# Patient Record
Sex: Female | Born: 1975 | Race: White | Hispanic: No | Marital: Married | Smoking: Current every day smoker
Health system: Southern US, Community
[De-identification: ages and names within clinical notes are randomized; demographics above are authoritative.]

## PROBLEM LIST (undated history)

## (undated) DIAGNOSIS — N83209 Unspecified ovarian cyst, unspecified side: Secondary | ICD-10-CM

## (undated) DIAGNOSIS — F419 Anxiety disorder, unspecified: Secondary | ICD-10-CM

## (undated) DIAGNOSIS — F329 Major depressive disorder, single episode, unspecified: Secondary | ICD-10-CM

## (undated) DIAGNOSIS — F1010 Alcohol abuse, uncomplicated: Secondary | ICD-10-CM

## (undated) DIAGNOSIS — F32A Depression, unspecified: Secondary | ICD-10-CM

## (undated) HISTORY — DX: Anxiety disorder, unspecified: F41.9

## (undated) HISTORY — PX: TOTAL KNEE ARTHROPLASTY: SHX125

## (undated) HISTORY — DX: Depression, unspecified: F32.A

## (undated) HISTORY — DX: Major depressive disorder, single episode, unspecified: F32.9

---

## 2008-01-20 ENCOUNTER — Emergency Department (HOSPITAL_BASED_OUTPATIENT_CLINIC_OR_DEPARTMENT_OTHER): Admission: EM | Admit: 2008-01-20 | Discharge: 2008-01-20 | Payer: Self-pay | Admitting: Emergency Medicine

## 2008-07-01 ENCOUNTER — Emergency Department (HOSPITAL_BASED_OUTPATIENT_CLINIC_OR_DEPARTMENT_OTHER): Admission: EM | Admit: 2008-07-01 | Discharge: 2008-07-01 | Payer: Self-pay | Admitting: Emergency Medicine

## 2008-07-01 ENCOUNTER — Ambulatory Visit: Payer: Self-pay | Admitting: Diagnostic Radiology

## 2010-08-11 ENCOUNTER — Encounter: Payer: Self-pay | Admitting: Emergency Medicine

## 2010-09-14 ENCOUNTER — Emergency Department (HOSPITAL_BASED_OUTPATIENT_CLINIC_OR_DEPARTMENT_OTHER)
Admission: EM | Admit: 2010-09-14 | Discharge: 2010-09-14 | Disposition: A | Discharge status: Home / Self Care | Payer: 59 | Attending: Emergency Medicine | Admitting: Emergency Medicine

## 2010-09-14 DIAGNOSIS — IMO0001 Reserved for inherently not codable concepts without codable children: Secondary | ICD-10-CM | POA: Insufficient documentation

## 2010-09-14 DIAGNOSIS — R05 Cough: Secondary | ICD-10-CM | POA: Insufficient documentation

## 2010-09-14 DIAGNOSIS — F319 Bipolar disorder, unspecified: Secondary | ICD-10-CM | POA: Insufficient documentation

## 2010-09-14 DIAGNOSIS — R059 Cough, unspecified: Secondary | ICD-10-CM | POA: Insufficient documentation

## 2010-09-14 DIAGNOSIS — J029 Acute pharyngitis, unspecified: Secondary | ICD-10-CM | POA: Insufficient documentation

## 2010-09-14 DIAGNOSIS — B9789 Other viral agents as the cause of diseases classified elsewhere: Secondary | ICD-10-CM | POA: Insufficient documentation

## 2010-09-14 DIAGNOSIS — F17200 Nicotine dependence, unspecified, uncomplicated: Secondary | ICD-10-CM | POA: Insufficient documentation

## 2011-04-16 LAB — CBC
MCHC: 35.1
MCV: 88.3
Platelets: 236

## 2011-04-16 LAB — URINALYSIS, ROUTINE W REFLEX MICROSCOPIC
Hgb urine dipstick: NEGATIVE
Ketones, ur: NEGATIVE
Protein, ur: NEGATIVE
Urobilinogen, UA: 0.2

## 2011-04-16 LAB — URINE MICROSCOPIC-ADD ON

## 2011-04-16 LAB — DIFFERENTIAL
Basophils Relative: 2 — ABNORMAL HIGH
Eosinophils Absolute: 0.2
Monocytes Relative: 8
Neutrophils Relative %: 67

## 2011-04-16 LAB — BASIC METABOLIC PANEL
BUN: 16
CO2: 22
Chloride: 106
Creatinine, Ser: 0.8

## 2012-03-27 ENCOUNTER — Emergency Department (HOSPITAL_BASED_OUTPATIENT_CLINIC_OR_DEPARTMENT_OTHER)
Admission: EM | Admit: 2012-03-27 | Discharge: 2012-03-27 | Disposition: A | Discharge status: Home / Self Care | Payer: 59

## 2012-03-27 ENCOUNTER — Encounter (HOSPITAL_BASED_OUTPATIENT_CLINIC_OR_DEPARTMENT_OTHER): Payer: Self-pay | Admitting: *Deleted

## 2012-03-27 NOTE — ED Notes (Signed)
Called x 3 in waiting room and no answer. Did not inform staff that she was leaving

## 2012-03-27 NOTE — ED Notes (Signed)
Pt. Has multiple complaints. States she has been taking care of her twins alone and this is "wearing her body out"  C/o left wrist pain denies injury no deformity noted. C/o bilateral leg pain and "brusing to her legs from taking care of her kids. Also, c/o "heavy" menstural bleeding for past 2 years.

## 2012-03-27 NOTE — ED Notes (Signed)
Called x 2 in waiting area 

## 2012-09-08 ENCOUNTER — Encounter (HOSPITAL_COMMUNITY): Payer: Self-pay | Admitting: *Deleted

## 2012-09-08 ENCOUNTER — Ambulatory Visit (HOSPITAL_COMMUNITY)
Admission: RE | Admit: 2012-09-08 | Discharge: 2012-09-08 | Disposition: A | Discharge status: Home / Self Care | Payer: 59 | Source: Home / Self Care | Attending: Psychiatry | Admitting: Psychiatry

## 2012-09-08 ENCOUNTER — Emergency Department (HOSPITAL_COMMUNITY)
Admission: EM | Admit: 2012-09-08 | Discharge: 2012-09-08 | Disposition: A | Discharge status: Other Acute Inpatient Hospital | Payer: 59 | Attending: Emergency Medicine | Admitting: Emergency Medicine

## 2012-09-08 ENCOUNTER — Inpatient Hospital Stay (HOSPITAL_COMMUNITY)
Admission: EM | Admit: 2012-09-08 | Discharge: 2012-09-10 | DRG: 897 | Disposition: A | Discharge status: Home / Self Care | Payer: 59 | Source: Intra-hospital | Attending: Psychiatry | Admitting: Psychiatry

## 2012-09-08 ENCOUNTER — Encounter (HOSPITAL_COMMUNITY): Payer: Self-pay | Admitting: Emergency Medicine

## 2012-09-08 DIAGNOSIS — Z7982 Long term (current) use of aspirin: Secondary | ICD-10-CM | POA: Insufficient documentation

## 2012-09-08 DIAGNOSIS — Z79899 Other long term (current) drug therapy: Secondary | ICD-10-CM | POA: Insufficient documentation

## 2012-09-08 DIAGNOSIS — F329 Major depressive disorder, single episode, unspecified: Secondary | ICD-10-CM | POA: Diagnosis present

## 2012-09-08 DIAGNOSIS — F411 Generalized anxiety disorder: Secondary | ICD-10-CM | POA: Insufficient documentation

## 2012-09-08 DIAGNOSIS — F1310 Sedative, hypnotic or anxiolytic abuse, uncomplicated: Principal | ICD-10-CM | POA: Diagnosis present

## 2012-09-08 DIAGNOSIS — F1210 Cannabis abuse, uncomplicated: Secondary | ICD-10-CM | POA: Diagnosis present

## 2012-09-08 DIAGNOSIS — F419 Anxiety disorder, unspecified: Secondary | ICD-10-CM

## 2012-09-08 DIAGNOSIS — F3289 Other specified depressive episodes: Secondary | ICD-10-CM | POA: Insufficient documentation

## 2012-09-08 DIAGNOSIS — F17200 Nicotine dependence, unspecified, uncomplicated: Secondary | ICD-10-CM | POA: Insufficient documentation

## 2012-09-08 DIAGNOSIS — F4310 Post-traumatic stress disorder, unspecified: Secondary | ICD-10-CM | POA: Diagnosis present

## 2012-09-08 HISTORY — DX: Alcohol abuse, uncomplicated: F10.10

## 2012-09-08 LAB — CBC WITH DIFFERENTIAL/PLATELET
Eosinophils Relative: 1 % (ref 0–5)
HCT: 37.7 % (ref 36.0–46.0)
Hemoglobin: 12.5 g/dL (ref 12.0–15.0)
Lymphocytes Relative: 26 % (ref 12–46)
Lymphs Abs: 2.4 10*3/uL (ref 0.7–4.0)
MCV: 84.2 fL (ref 78.0–100.0)
Monocytes Absolute: 0.5 10*3/uL (ref 0.1–1.0)
RBC: 4.48 MIL/uL (ref 3.87–5.11)
WBC: 9.2 10*3/uL (ref 4.0–10.5)

## 2012-09-08 LAB — COMPREHENSIVE METABOLIC PANEL
ALT: 13 U/L (ref 0–35)
CO2: 25 mEq/L (ref 19–32)
Calcium: 9.7 mg/dL (ref 8.4–10.5)
Chloride: 101 mEq/L (ref 96–112)
Creatinine, Ser: 0.68 mg/dL (ref 0.50–1.10)
GFR calc Af Amer: >90 mL/min (ref >90)
GFR calc non Af Amer: >90 mL/min (ref >90)
Glucose, Bld: 94 mg/dL (ref 70–99)
Total Bilirubin: 0.5 mg/dL (ref 0.3–1.2)

## 2012-09-08 LAB — RAPID URINE DRUG SCREEN, HOSP PERFORMED
Benzodiazepines: POSITIVE — AB
Cocaine: NOT DETECTED

## 2012-09-08 LAB — URINALYSIS, ROUTINE W REFLEX MICROSCOPIC
Glucose, UA: NEGATIVE mg/dL
Specific Gravity, Urine: 1.007 (ref 1.005–1.030)
pH: 6.5 (ref 5.0–8.0)

## 2012-09-08 LAB — URINE MICROSCOPIC-ADD ON

## 2012-09-08 LAB — ETHANOL: Alcohol, Ethyl (B): <11 mg/dL (ref 0–11)

## 2012-09-08 MED ORDER — MAGNESIUM HYDROXIDE 400 MG/5ML PO SUSP: 30.0000 mL | Freq: Every day | ORAL | Status: DC | PRN

## 2012-09-08 MED ORDER — INFLUENZA VIRUS VACC SPLIT PF IM SUSP
0.5000 mL | INTRAMUSCULAR | Status: AC
  Administered 2012-09-09: 0.5 mL via INTRAMUSCULAR

## 2012-09-08 MED ORDER — CHLORDIAZEPOXIDE HCL 25 MG PO CAPS
25.0000 mg | ORAL_CAPSULE | Freq: Four times a day (QID) | ORAL | Status: DC | PRN
  Administered 2012-09-08 – 2012-09-09 (×2): 25 mg via ORAL
  Filled 2012-09-08 (×4): qty 1

## 2012-09-08 MED ORDER — NICOTINE 21 MG/24HR TD PT24
21.0000 mg | MEDICATED_PATCH | Freq: Once | TRANSDERMAL | Status: DC
  Administered 2012-09-08: 21 mg via TRANSDERMAL
  Filled 2012-09-08: qty 1

## 2012-09-08 MED ORDER — ACETAMINOPHEN 325 MG PO TABS
650.0000 mg | ORAL_TABLET | Freq: Four times a day (QID) | ORAL | Status: DC | PRN
  Administered 2012-09-08 – 2012-09-09 (×2): 650 mg via ORAL

## 2012-09-08 MED ORDER — NICOTINE 21 MG/24HR TD PT24
21.0000 mg | MEDICATED_PATCH | Freq: Every day | TRANSDERMAL | Status: DC
  Administered 2012-09-09: 21 mg via TRANSDERMAL
  Filled 2012-09-08 (×4): qty 1

## 2012-09-08 MED ORDER — ALUM & MAG HYDROXIDE-SIMETH 200-200-20 MG/5ML PO SUSP: 30.0000 mL | ORAL | Status: DC | PRN

## 2012-09-08 MED ORDER — PNEUMOCOCCAL VAC POLYVALENT 25 MCG/0.5ML IJ INJ
0.5000 mL | INJECTION | INTRAMUSCULAR | Status: AC
  Administered 2012-09-09: 0.5 mL via INTRAMUSCULAR

## 2012-09-08 MED ORDER — HYDROXYZINE HCL 50 MG PO TABS: 50.0000 mg | ORAL_TABLET | Freq: Every evening | ORAL | Status: DC | PRN

## 2012-09-08 NOTE — Tx Team (Signed)
Initial Interdisciplinary Treatment Plan  PATIENT STRENGTHS: (choose at least two) Ability for insight Active sense of humor Average or above average intelligence Capable of independent living Communication skills General fund of knowledge Motivation for treatment/growth  PATIENT STRESSORS: Marital or family conflict Medication change or noncompliance Substance abuse   PROBLEM LIST: Problem List/Patient Goals Date to be addressed Date deferred Reason deferred Estimated date of resolution  Depression 09/08/12     Anxiety 09/08/12                                                DISCHARGE CRITERIA:  Ability to meet basic life and health needs Adequate post-discharge living arrangements Improved stabilization in mood, thinking, and/or behavior Safe-care adequate arrangements made Withdrawal symptoms are absent or subacute and managed without 24-hour nursing intervention  PRELIMINARY DISCHARGE PLAN: Attend aftercare/continuing care group Placement in alternative living arrangements  PATIENT/FAMIILY INVOLVEMENT: This treatment plan has been presented to and reviewed with the patient, Nautia Lem, and/or family member, .  The patient and family have been given the opportunity to ask questions and make suggestions.  Donisha Hoch, [place] 09/08/2012, 9:40 PM

## 2012-09-08 NOTE — ED Provider Notes (Signed)
Medical screening examination/treatment/procedure(s) were performed by non-physician practitioner and as supervising physician I was immediately available for consultation/collaboration.  Kallen Delatorre R. Beatrix Breece, MD 09/08/12 2311 

## 2012-09-08 NOTE — ED Notes (Signed)
Pt is sent over from [facility] for detox, pt is voluntary and was to come over for med clearence and to be sent back over to [facility]. Denies any Si/Hi
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
## 2012-09-08 NOTE — ED Provider Notes (Signed)
History    This chart was scribed for non-physician practitioner working with Penny Mata. [name], MD by Leone Payor, ED Scribe. This patient was seen in room WLCON/WLCON and the patient's care was started at 1644.   CSN: [id number]  Arrival date & time 09/08/12  1644   First MD Initiated Contact with Patient 09/08/12 1807      Chief Complaint  Patient presents with  . Medical Clearance    (Consider location/radiation/quality/duration/timing/severity/associated sxs/prior treatment) The history is provided by the patient. No language interpreter was used.    Penny Mata is a 37 y.o. female who presents to the Emergency Department requesting medical clearance. Pt went to [facility] from psychiatrist today for depression, anxiety, isolation. Pt has been taking xanax on and off for a few years but regularly for 6-7 months. Pt had twins that were born prematurely at 46 weeks and states when she hears one of the babies cries, she thinks back to the time the babies spent in the neonatal ICU. She also started excessively drinking 14 months after her father's death. She states her depression and anxiety has worsened after these 2 traumas. Pt requests detox from xanax. She takes xanax 0.5 mg 3x daily with last dose about 1.5 hours ago. She also took an adderall x1 today even though she no longer needs to take it. Reports having thoughts of worthlessness, body image issues. She has SI but does not have a plan to go through with it. She has a 8 year old child that lives with her and an 4 year old that does not.   Pt is a current everyday smoker but denies alcohol use. Occasionally smokes marijuana.  Past Medical History  Diagnosis Date  . Alcohol abuse     Past Surgical History  Procedure Laterality Date  . Cesarean section    . Total knee arthroplasty      No family history on file.  History  Substance Use Topics  . Smoking status: Current Every Day Smoker  . Smokeless tobacco: Not  on file  . Alcohol Use: No    No OB history provided.   Review of Systems  Constitutional: Negative.   HENT: Negative.   Eyes: Negative.   Respiratory: Negative.   Cardiovascular: Negative.   Gastrointestinal: Negative.   Neurological: Negative.   Psychiatric/Behavioral: Negative.   All other systems reviewed and are negative.    Allergies  Review of patient's allergies indicates no known allergies.  Home Medications   Current Outpatient Rx  Name  Route  Sig  Dispense  Refill  . acetaminophen (TYLENOL) 500 MG tablet   Oral   Take 1,000 mg by mouth every 6 (six) hours as needed. For pain         . ALPRAZolam (XANAX) 0.5 MG tablet   Oral   Take 0.5 mg by mouth 3 (three) times daily as needed. For anxiety         . amphetamine-dextroamphetamine (ADDERALL) 20 MG tablet   Oral   Take 10-20 mg by mouth 3 (three) times daily. 10 mg in the morning and at noon and 20 mg in the evening         . aspirin 81 MG tablet   Oral   Take 81 mg by mouth daily. For tingling feeling         . ibuprofen (ADVIL,MOTRIN) 200 MG tablet   Oral   Take 400 mg by mouth every 6 (six) hours as needed. For pain  BP 123/64  Pulse 78  Temp(Src) 97.6 F (36.4 C) (Oral)  SpO2 100%  Physical Exam  Nursing note and vitals reviewed. Constitutional: She is oriented to person, place, and time. She appears well-developed and well-nourished.  HENT:  Head: Normocephalic and atraumatic.  Eyes: Conjunctivae and EOM are normal. Pupils are equal, round, and reactive to light.  Neck: Normal range of motion. Neck supple.  Cardiovascular: Normal rate.   Pulmonary/Chest: Effort normal.  Abdominal: Soft.  Musculoskeletal: Normal range of motion. She exhibits no edema and no tenderness.  Neurological: She is alert and oriented to person, place, and time. She has normal reflexes.  Skin: Skin is warm and dry.  Psychiatric: She has a normal mood and affect.    ED Course  Procedures  (including critical care time)  DIAGNOSTIC STUDIES: Oxygen Saturation is 100% on room air, normal by my interpretation.    COORDINATION OF CARE: 6:25 PM Discussed treatment plan which includes CBC panel, UA, drug screen panel, comprehensive metabolic panel with pt at bedside and pt agreed to plan.    Labs Reviewed  CBC WITH DIFFERENTIAL  URINALYSIS, ROUTINE W REFLEX MICROSCOPIC  COMPREHENSIVE METABOLIC PANEL  URINE RAPID DRUG SCREEN (HOSP PERFORMED)  ETHANOL   No results found.   No diagnosis found.    MDM  Ms. Sollars has been medically cleared for behavior health. She will leave with a security guard and go across the street of behavior health to be treated for depression and anxiety. The accepting physician is Dr. Dub Mikes.  I personally performed the services described in this documentation, which was scribed in my presence. The recorded information has been reviewed and is accurate.     Remi Haggard, NP 09/08/12 2017
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
## 2012-09-09 ENCOUNTER — Encounter (HOSPITAL_COMMUNITY): Payer: Self-pay | Admitting: Psychiatry

## 2012-09-09 DIAGNOSIS — F1320 Sedative, hypnotic or anxiolytic dependence, uncomplicated: Secondary | ICD-10-CM

## 2012-09-09 DIAGNOSIS — F4310 Post-traumatic stress disorder, unspecified: Secondary | ICD-10-CM | POA: Diagnosis present

## 2012-09-09 DIAGNOSIS — F39 Unspecified mood [affective] disorder: Secondary | ICD-10-CM

## 2012-09-09 DIAGNOSIS — F1310 Sedative, hypnotic or anxiolytic abuse, uncomplicated: Secondary | ICD-10-CM | POA: Diagnosis present

## 2012-09-09 DIAGNOSIS — F1020 Alcohol dependence, uncomplicated: Secondary | ICD-10-CM

## 2012-09-09 DIAGNOSIS — F1210 Cannabis abuse, uncomplicated: Secondary | ICD-10-CM | POA: Diagnosis present

## 2012-09-09 MED ORDER — ASPIRIN 81 MG PO CHEW
81.0000 mg | CHEWABLE_TABLET | Freq: Every day | ORAL | Status: DC
  Administered 2012-09-10: 81 mg via ORAL
  Filled 2012-09-09 (×4): qty 1

## 2012-09-09 MED ORDER — DULOXETINE HCL 30 MG PO CPEP
30.0000 mg | ORAL_CAPSULE | Freq: Every day | ORAL | Status: DC
  Administered 2012-09-09 – 2012-09-10 (×2): 30 mg via ORAL
  Filled 2012-09-09 (×4): qty 1

## 2012-09-09 MED ORDER — GABAPENTIN 100 MG PO CAPS
100.0000 mg | ORAL_CAPSULE | Freq: Three times a day (TID) | ORAL | Status: DC
  Administered 2012-09-09 – 2012-09-10 (×2): 100 mg via ORAL
  Filled 2012-09-09 (×8): qty 1

## 2012-09-09 MED ORDER — IBUPROFEN 200 MG PO TABS
400.0000 mg | ORAL_TABLET | Freq: Four times a day (QID) | ORAL | Status: DC | PRN
  Filled 2012-09-09: qty 2

## 2012-09-09 NOTE — Progress Notes (Signed)
[facility] Adult Case Management Discharge Plan :  Will you be returning to the same living situation after discharge: Yes,  Pt will be returning to her home. At discharge, do you have transportation home?:Yes,  Pt will transport herself home at discharge. Do you have the ability to pay for your medications:Yes,  Patient is insured.  Release of information consent forms completed and in the chart;  Patient's signature needed at discharge.  Patient to Follow up at: Follow-up Information   Follow up with [facility] CD-IOP. (Pt has an appointment for IOP on Monday, February 24,2014 at 8:45 am. )    Contact information:    [address] [place], [place] [address] [phone]      Patient denies SI/HI:   Yes,  Patient does not endorse SI or HI at this time.    Safety Planning and Suicide Prevention discussed:  Yes,  safety planning and suicide prevention has been discussed with patient.  Ambert Virrueta L 09/09/2012, 5:05 PM
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
## 2012-09-09 NOTE — Progress Notes (Signed)
D: Patient denies HI and A/V hallucinations and admits to on and off thoughts of SI; patient reports sleep was poor and that she did not receive any medications; reports appetite to be improving ; reports energy level is low ; reports ability to pay attention is poor; rates depression as 5/10; rates hopelessness 2/10;   A: Monitored q 15 minutes; patient encouraged to attend groups; patient educated about medications; patient given medications per physician orders; patient encouraged to express feelings and/or concerns  R: Patient tearful this morning because she states I am very anxious about group and patient was given emotional support by mental health tech and nurse; patient was found later after group and hse was bright and animated with roommate but still complained of anxiety; physician made aware pf the status of patient ; patient's interaction with staff and peers is assertive but appropriate;  patient was able to set goal to talk with staff 1:1 when having feelings of SI; patient is taking medications as prescribed and tolerating medications; patient is attending all groups

## 2012-09-09 NOTE — Progress Notes (Signed)
Patient ID: Penny Mata, female   DOB: February 13, 1976, 37 y.o.   MRN: [id number] D. The patient was tearful this evening. Stated that she is being discharged tomorrow and thought she was going to spend the day with her two y.o. twins. She was told by DSS that she is not allowed to be with her children until they are through with their investigation. Did not want to attend AA meeting this evening due to being tearful and anxious and did not want to upset anyone else. A. Met with patient. Verbal support and encouragement given. Encouraged to go to group, particularly since she has identified AA/NA meetings as her main support system. R. The patient was able to attend substance abuse meeting. Stated she was looking forward to attending out patient therapy at [facility].

## 2012-09-09 NOTE — Progress Notes (Signed)
DSS worker called Doyne Keel who has permission to talk with staff about information; She wanted to discuss the emotional status on the individual and get some information about the patient; she wanted to speak with the psychiatrist; Dr. Dub Mikes was made aware; she was instructed that she would have to speak with the case manager/ social worker; DSS worker reported that the patient was presenting paranoid and was reporting to them that she was being watched and that wires where in the house and that they could not stay there anymore;

## 2012-09-09 NOTE — Progress Notes (Signed)
[facility] LCSW Aftercare Discharge Planning Group Note  09/09/2012 11:25 AM  Participation Quality:  Appropriate and Attentive  Affect:  Appropriate  Cognitive:  Alert, Appropriate and Oriented  Insight:  Developing/Improving  Engagement in Group:  Engaged  Modes of Intervention:  Discussion, Education, Problem-solving and Support  Summary of Progress/Problems:Pt attended d/c planning group.  Her affect was open and bright.  Pt rates depression at 4/5, anxiety at 6/7, She reported that she admitted to the hospital because she was abusing xanax.  Pt endorses passive SI, stating that she has has SI since her father died 18 months ago.  Pt contracted for safety.  She communicates that she has had these thoughts however, she would never act on them because she loves her children and would never want to put them through that.  Pt states that she has scheduled a psych eval at [facility] on Tuesday, February 25. SW will reschedule for later date.  Madhav Mohon L 09/09/2012, 11:25 AM

## 2012-09-09 NOTE — Progress Notes (Signed)
[facility] INPATIENT:  Family/Significant Other Suicide Prevention Education  Suicide Prevention Education:  Contact Attempts: Joen Laura and [place] Collins have been identified by the patient as the family member/significant other with whom the patient will be residing, and identified as the person(s) who will aid the patient in the event of a mental health crisis.  With written consent from the patient, two attempts were made to provide suicide prevention education, prior to and/or following the patient's discharge.  We were unsuccessful in providing suicide prevention education.  A suicide education pamphlet was given to the patient to share with family/significant other.  Date and time of first attempt:Penny Mata 2/21 5:00 Date and time of second attempt:Penny Mata 2/21 5:25  Suicide prevention education discussed on an one on one basis with patient.   Penny Mata 09/09/2012, 5:30 PM

## 2012-09-09 NOTE — Clinical Social Work Note (Signed)
[facility] Group Notes:  (Counselor/Nursing/MHT/Case Management/Adjunct)  06/03/2012 12:00 PM  Type of Therapy:  Group Therapy  Participation Level:  Did Not Attend  Penny Mata N 06/03/2012, 12:00 PM  
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
## 2012-09-09 NOTE — Progress Notes (Signed)
Adult Psychoeducational Group Note  Date:  09/09/2012 Time:  9:43 PM  Group Topic/Focus:  AA group  Participation Level:  Active  Participation Quality:  Appropriate  Affect:  Appropriate  Cognitive:  Alert  Insight: Improving  Engagement in Group:  Improving  Modes of Intervention:  Support  Additional Comments:    Flonnie Hailstone 09/09/2012, 9:43 PM

## 2012-09-10 DIAGNOSIS — F1910 Other psychoactive substance abuse, uncomplicated: Secondary | ICD-10-CM

## 2012-09-10 LAB — URINE CULTURE

## 2012-09-10 MED ORDER — HYDROXYZINE HCL 50 MG PO TABS: 50.0000 mg | ORAL_TABLET | Freq: Every evening | ORAL | Status: DC | PRN

## 2012-09-10 MED ORDER — GABAPENTIN 100 MG PO CAPS: 100.0000 mg | ORAL_CAPSULE | Freq: Three times a day (TID) | ORAL | Status: DC

## 2012-09-10 MED ORDER — DULOXETINE HCL 30 MG PO CPEP: 30.0000 mg | ORAL_CAPSULE | Freq: Every day | ORAL | Status: DC

## 2012-09-10 NOTE — Progress Notes (Signed)
D: Appears flat but mood is anxious. She is calm and cooperative with assessment. No acute distress noted. States she feels much improved and is requesting D/C. States she can move in with her mom and her mom will help her achieve her therapeutic plan. Her mother called and indicated as much to Clinical research associate. She denies SI/HI/AVH and contracts for safety. She offers no questions or concerns.  A: Support and encouragement provided. Safety has been maintained with Q15 min observation. Medications and POC reviewed and understanding verbalized. Meds given as ordered by MD. Encouraged to continue group attendance/participation.   R: Remains safe. She is anxious and cooperative. She is interacting well with peers. She is requesting D/C and offers no additional questions or concerns. Will continue Q15 minute observation and continue current POC.

## 2012-09-10 NOTE — Progress Notes (Signed)
Discharge note: Pt discharged. AVS and prescriptions provided. D/C plan and medications reviewed and understanding verbalized. Pt denies SI/HI/AVH. Belongings were returned and inventory signed.
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
## 2012-09-10 NOTE — Progress Notes (Signed)
Adult Psychoeducational Group Note  Date: 09/10/2012  Time: 0910  Group Topic/Focus:  Goals Group: The focus of this group is to help patients establish daily goals to achieve during treatment and discuss how the patient can incorporate goal setting into their daily lives to aide in recovery.  Participation Level: Did Not Attend  Participation Quality:  Affect:  Cognitive:  Insight:  Engagement in Group:  Modes of Intervention:  Additional Comments:  Stephan Nelis Sean  09/10/2012, 10:37 AM  

## 2012-09-10 NOTE — Clinical Social Work Note (Signed)
[facility] Group Notes:  (Clinical Social Work)  09/10/2012     10-11AM  Summary of Progress/Problems:   The main focus of today's process group was for the patient to identify ways in which they have in the past sabotaged their own recovery. Motivational Interviewing was utilized to ask the group members what they get out of their substance use, and what they want to change.  The Stages of Change were explained, and members identified where they currently are with regard to stages of change.  The patient expressed that she is motivated at 10 out of 10 to change, and that her motivating force is her children.  She is separating from her husband, and stated there will be no narcotics in her new home.  She became irritable later in the group, said she did not want to talk about the stages of change any longer, and intruded into work the group was doing by insisting on immediate discharge.  CSW agreed to take her concerns to doctor, and did this.  Ultimately, it was decided to discharge patient as indicated by her weekday CSW.  Type of Therapy:  Group Therapy - Process   Participation Level:  Active  Participation Quality:  Attentive, Redirectable and Resistant  Affect:  Anxious and Irritable  Cognitive:  Oriented  Insight:  Engaged  Engagement in Therapy:  Engaged  Modes of Intervention:  Education, Teacher, English as a foreign language, Exploration, Discussion, Motivational Interviewing   Ambrose Mantle, LCSW 09/10/2012, 12:25 PM

## 2012-09-12 ENCOUNTER — Encounter (HOSPITAL_COMMUNITY): Payer: Self-pay

## 2012-09-12 ENCOUNTER — Other Ambulatory Visit (HOSPITAL_COMMUNITY): Payer: 59 | Attending: Psychiatry | Admitting: Psychiatry

## 2012-09-12 DIAGNOSIS — F321 Major depressive disorder, single episode, moderate: Secondary | ICD-10-CM

## 2012-09-12 DIAGNOSIS — F4310 Post-traumatic stress disorder, unspecified: Secondary | ICD-10-CM
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
## 2012-09-12 NOTE — Progress Notes (Signed)
    Daily Group Progress Note  Program: IOP  Group Time: 9:00-10:30 am   Participation Level: Active  Behavioral Response: Appropriate  Type of Therapy:  Process Group  Summary of Progress: Today was patients first day in the group. She was introduced, was attentive and observed the group process.      Group Time: 10:30 am - 12:00 pm   Participation Level:  Active  Behavioral Response: Appropriate  Type of Therapy: Psycho-education Group  Summary of Progress: Patient participated in a group with a focus on grief and loss and identified current losses they are experiencing and appropriate ways to grieve.   Carman Ching, LCSW

## 2012-09-13 ENCOUNTER — Other Ambulatory Visit (HOSPITAL_COMMUNITY): Payer: 59

## 2012-09-13 ENCOUNTER — Telehealth (HOSPITAL_COMMUNITY): Payer: Self-pay | Admitting: Psychiatry

## 2012-09-13 NOTE — Progress Notes (Signed)
Patient ID: Penny Mata, female   DOB: [birth year], 37 y.o.   MRN: [id number] D:  Placed call to [facility], spoke to [place] re: pre-cert.  He auth'd twelve MH-IOP days with auth # [id number].

## 2012-09-13 NOTE — Progress Notes (Signed)
Patient Discharge Instructions:  Next Level Care Provider Has Access to the EMR, 09/13/12 Records provided to [facility] via [facility]/Epic access.  Jerelene Redden, 09/13/2012, 3:14 PM

## 2012-09-14 ENCOUNTER — Other Ambulatory Visit (HOSPITAL_COMMUNITY): Payer: 59 | Admitting: Psychiatry

## 2012-09-14 DIAGNOSIS — F4310 Post-traumatic stress disorder, unspecified: Secondary | ICD-10-CM

## 2012-09-15 ENCOUNTER — Other Ambulatory Visit (HOSPITAL_COMMUNITY): Payer: 59 | Admitting: Psychiatry

## 2012-09-15 DIAGNOSIS — F4310 Post-traumatic stress disorder, unspecified: Secondary | ICD-10-CM
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
## 2012-09-15 NOTE — Progress Notes (Signed)
    Daily Group Progress Note  Program: IOP  Group Time: 9:00-10:30 am   Participation Level: Active  Behavioral Response: Appropriate  Type of Therapy:  Psycho-education Group  Summary of Progress:Patient participated in a discussion Bipolar Disorder and Depression and shared personal experiences they have with their own symptoms. Patient learned how to identify them and act early on before the symptoms increase.       Group Time: 10:30 am - 12:00 pm   Participation Level:  Active  Behavioral Response: Appropriate  Type of Therapy: Psycho-education Group  Summary of Progress: Patient learned about Anxiety and how to recognize symptoms and act quickly using various coping skills to manage the symptoms.   Alyze Lauf E, LCSW 

## 2012-09-16 ENCOUNTER — Other Ambulatory Visit (HOSPITAL_COMMUNITY): Payer: 59 | Admitting: Psychiatry

## 2012-09-16 DIAGNOSIS — F4310 Post-traumatic stress disorder, unspecified: Secondary | ICD-10-CM
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
## 2012-09-19 ENCOUNTER — Other Ambulatory Visit (HOSPITAL_COMMUNITY): Payer: 59 | Attending: Psychiatry | Admitting: Psychiatry

## 2012-09-19 DIAGNOSIS — F3289 Other specified depressive episodes: Secondary | ICD-10-CM | POA: Insufficient documentation

## 2012-09-19 DIAGNOSIS — F329 Major depressive disorder, single episode, unspecified: Secondary | ICD-10-CM | POA: Insufficient documentation

## 2012-09-19 DIAGNOSIS — F4310 Post-traumatic stress disorder, unspecified: Secondary | ICD-10-CM | POA: Insufficient documentation
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
## 2012-09-19 NOTE — Progress Notes (Signed)
    Daily Group Progress Note  Program: IOP  Group Time: 9:00-10:30 am   Participation Level: Active  Behavioral Response: Appropriate  Type of Therapy:  Process Group  Summary of Progress: Patient is processing fears of not getting full custody of her children back. Patient said she was grateful to have alternating weekends. She is processing anger towards her husband and guilt towards herself for not better protecting her children from trauma.      Group Time: 10:30 am - 12:00 pm   Participation Level:  Active  Behavioral Response: Appropriate  Type of Therapy: Psycho-education Group  Summary of Progress: : patient participated in the goodbye ceremony to a member leaving and practiced the skill of healthy closure.   Carman Ching, LCSW

## 2012-09-20 ENCOUNTER — Telehealth (HOSPITAL_COMMUNITY): Payer: Self-pay | Admitting: Psychiatry

## 2012-09-20 ENCOUNTER — Other Ambulatory Visit (HOSPITAL_COMMUNITY): Payer: 59

## 2012-09-21 ENCOUNTER — Telehealth (HOSPITAL_COMMUNITY): Payer: Self-pay | Admitting: Psychiatry

## 2012-09-21 ENCOUNTER — Other Ambulatory Visit (HOSPITAL_COMMUNITY): Payer: 59

## 2012-09-21 NOTE — Telephone Encounter (Signed)
Pt phoned urgently this a.m., stating that her problems are too huge for the group and would like to be referred to an individual counselor.  States she no longer has insurance.  Writer encouraged pt to come back at least one more day in order to discuss discharge with Dr. Rutherford Limerick. Pt plans to return tomorrow.

## 2012-09-22 ENCOUNTER — Other Ambulatory Visit (HOSPITAL_COMMUNITY): Payer: 59

## 2012-09-23 ENCOUNTER — Other Ambulatory Visit (HOSPITAL_COMMUNITY): Payer: 59

## 2012-09-26 ENCOUNTER — Other Ambulatory Visit (HOSPITAL_COMMUNITY): Payer: 59
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
## 2012-09-26 NOTE — Patient Instructions (Signed)
Pt didn't return to [facility] nor did she call.  Follow up with Dr. Evelene Croon and therapist of choice.

## 2012-09-27 ENCOUNTER — Other Ambulatory Visit (HOSPITAL_COMMUNITY): Payer: 59

## 2012-09-28 ENCOUNTER — Other Ambulatory Visit (HOSPITAL_COMMUNITY): Payer: 59

## 2012-09-29 ENCOUNTER — Other Ambulatory Visit (HOSPITAL_COMMUNITY): Payer: 59

## 2012-09-30 ENCOUNTER — Other Ambulatory Visit (HOSPITAL_COMMUNITY): Payer: 59

## 2012-10-03 ENCOUNTER — Other Ambulatory Visit (HOSPITAL_COMMUNITY): Payer: 59

## 2012-10-28 ENCOUNTER — Other Ambulatory Visit (HOSPITAL_COMMUNITY): Payer: Self-pay | Admitting: Physician Assistant

## 2012-10-30 ENCOUNTER — Other Ambulatory Visit (HOSPITAL_COMMUNITY): Payer: Self-pay | Admitting: Physician Assistant

## 2013-06-28 ENCOUNTER — Encounter (HOSPITAL_COMMUNITY): Payer: Self-pay

## 2013-06-28 ENCOUNTER — Inpatient Hospital Stay (HOSPITAL_COMMUNITY)
Admission: AD | Admit: 2013-06-28 | Discharge: 2013-06-28 | Disposition: A | Discharge status: Home / Self Care | Payer: 59 | Source: Ambulatory Visit | Attending: Obstetrics and Gynecology | Admitting: Obstetrics and Gynecology

## 2013-06-28 ENCOUNTER — Inpatient Hospital Stay (HOSPITAL_COMMUNITY): Payer: Self-pay

## 2013-06-28 DIAGNOSIS — N390 Urinary tract infection, site not specified: Secondary | ICD-10-CM | POA: Insufficient documentation

## 2013-06-28 DIAGNOSIS — K5900 Constipation, unspecified: Secondary | ICD-10-CM | POA: Insufficient documentation

## 2013-06-28 DIAGNOSIS — N926 Irregular menstruation, unspecified: Secondary | ICD-10-CM

## 2013-06-28 DIAGNOSIS — R350 Frequency of micturition: Secondary | ICD-10-CM | POA: Insufficient documentation

## 2013-06-28 DIAGNOSIS — N920 Excessive and frequent menstruation with regular cycle: Secondary | ICD-10-CM | POA: Insufficient documentation

## 2013-06-28 DIAGNOSIS — R109 Unspecified abdominal pain: Secondary | ICD-10-CM | POA: Insufficient documentation

## 2013-06-28 DIAGNOSIS — N939 Abnormal uterine and vaginal bleeding, unspecified: Secondary | ICD-10-CM

## 2013-06-28 DIAGNOSIS — N946 Dysmenorrhea, unspecified: Secondary | ICD-10-CM | POA: Insufficient documentation

## 2013-06-28 HISTORY — DX: Unspecified ovarian cyst, unspecified side: N83.209

## 2013-06-28 LAB — CBC
HCT: 37.8 % (ref 36.0–46.0)
MCV: 87.9 fL (ref 78.0–100.0)
RBC: 4.3 MIL/uL (ref 3.87–5.11)
WBC: 8.4 10*3/uL (ref 4.0–10.5)

## 2013-06-28 LAB — URINALYSIS, ROUTINE W REFLEX MICROSCOPIC
Bilirubin Urine: NEGATIVE
Leukocytes, UA: NEGATIVE
Specific Gravity, Urine: 1.015 (ref 1.005–1.030)
Urobilinogen, UA: 0.2 mg/dL (ref 0.0–1.0)
pH: 7 (ref 5.0–8.0)

## 2013-06-28 LAB — URINE MICROSCOPIC-ADD ON

## 2013-06-28 LAB — WET PREP, GENITAL
Clue Cells Wet Prep HPF POC: NONE SEEN
Yeast Wet Prep HPF POC: NONE SEEN

## 2013-06-28 MED ORDER — CIPROFLOXACIN HCL 250 MG PO TABS: 250.0000 mg | ORAL_TABLET | Freq: Two times a day (BID) | ORAL | Status: DC

## 2013-06-28 MED ORDER — OXYCODONE-ACETAMINOPHEN 5-325 MG PO TABS: 1.0000 | ORAL_TABLET | ORAL | Status: DC | PRN

## 2013-06-28 MED ORDER — KETOROLAC TROMETHAMINE 60 MG/2ML IM SOLN
60.0000 mg | Freq: Once | INTRAMUSCULAR | Status: AC
  Administered 2013-06-28: 60 mg via INTRAMUSCULAR
  Filled 2013-06-28: qty 2

## 2013-06-28 MED ORDER — HYDROMORPHONE HCL PF 1 MG/ML IJ SOLN
1.0000 mg | Freq: Once | INTRAMUSCULAR | Status: AC
  Administered 2013-06-28: 1 mg via INTRAMUSCULAR
  Filled 2013-06-28: qty 1

## 2013-06-28 NOTE — MAU Provider Note (Signed)
History     CSN: [id number]  Arrival date and time: 06/28/13 1400   First Provider Initiated Contact with Patient 06/28/13 1607      Chief Complaint  Patient presents with  . Vaginal Bleeding  . Abdominal Pain   HPI [name] is a 37 y.o. [phone] who presents to MAU today with vaginal bleeding and lower abdominal pain. The patient states that she has had irregular bleeding since the birth of her twins in 2011. She was on Depo Provera x 1 year post partum, but has not used anything for birth control since then. The patient states that she was going to have an ablation for DUB but lost her health insurance prior to the procedure. The patient also reports increased frequency and urgency of urination, 2-3 episodes of loose stool and frequent constipation. The patient denies dysuria, N/V, fever or flank pain. She states that she feels weak and emotional often.   OB History   Grav Para Term Preterm Abortions TAB SAB Ect Mult Living   1        1 2       Past Medical History  Diagnosis Date  . Alcohol abuse   . Depression   . Anxiety   . Ovarian cyst     Past Surgical History  Procedure Laterality Date  . Cesarean section    . Total knee arthroplasty      Family History  Problem Relation Age of Onset  . Depression Mother   . Anxiety disorder Father   . Depression Father   . Alcohol abuse Father   . Drug abuse Father   . Depression Sister     History  Substance Use Topics  . Smoking status: Current Every Day Smoker -- 0.50 packs/day    Types: Cigarettes  . Smokeless tobacco: Not on file  . Alcohol Use: No    Allergies: No Known Allergies  Prescriptions prior to admission  Medication Sig Dispense Refill  . acetaminophen (TYLENOL) 500 MG tablet Take 1,000 mg by mouth every 6 (six) hours as needed. For pain      . ALPRAZolam (XANAX) 0.5 MG tablet Take 0.5 mg by mouth at bedtime as needed for anxiety.      . DULoxetine (CYMBALTA) 30 MG capsule Take  1 capsule (30 mg total) by mouth daily.  30 capsule  0  . ibuprofen (ADVIL,MOTRIN) 200 MG tablet Take 400 mg by mouth every 6 (six) hours as needed. For pain        Review of Systems  Constitutional: Positive for malaise/fatigue. Negative for fever.  Gastrointestinal: Positive for abdominal pain. Negative for nausea, vomiting, diarrhea and constipation.  Genitourinary: Positive for urgency and frequency. Negative for dysuria and flank pain.       + vaginal bleeding, vaginal discharge  Neurological: Positive for weakness. Negative for dizziness and loss of consciousness.   Physical Exam   Blood pressure 114/76, pulse 95, temperature 98.2 F (36.8 C), temperature source Oral, resp. rate 20, last menstrual period 06/26/2013.  Physical Exam  Constitutional: She is oriented to person, place, and time. She appears well-developed and well-nourished. No distress.  HENT:  Head: Normocephalic and atraumatic.  Cardiovascular: Normal rate, regular rhythm and normal heart sounds.   Respiratory: Effort normal and breath sounds normal. No respiratory distress.  GI: Soft. She exhibits no distension and no mass. There is tenderness (mild to moderate diffuse tenderness to palpation). There is no rebound and no guarding.  Genitourinary: Uterus is  tender (patient reports severe pain with palpation on bimanual exam without guarding). Uterus is not enlarged. Cervix exhibits no motion tenderness, no discharge and no friability. Right adnexum displays tenderness. Right adnexum displays no mass. Left adnexum displays tenderness. Left adnexum displays no mass. There is bleeding (small amount of blood in the vagina) around the vagina. No vaginal discharge found.  Neurological: She is alert and oriented to person, place, and time.  Skin: Skin is warm and dry. No erythema.  Psychiatric: She has a normal mood and affect.   Results for orders placed during the hospital encounter of 06/28/13 (from the past 24 hour(s))   URINALYSIS, ROUTINE W REFLEX MICROSCOPIC     Status: Abnormal   Collection Time    06/28/13  2:20 PM      Result Value Range   Color, Urine YELLOW  YELLOW   APPearance HAZY (*) CLEAR   Specific Gravity, Urine 1.015  1.005 - 1.030   pH 7.0  5.0 - 8.0   Glucose, UA NEGATIVE  NEGATIVE mg/dL   Hgb urine dipstick LARGE (*) NEGATIVE   Bilirubin Urine NEGATIVE  NEGATIVE   Ketones, ur NEGATIVE  NEGATIVE mg/dL   Protein, ur NEGATIVE  NEGATIVE mg/dL   Urobilinogen, UA 0.2  0.0 - 1.0 mg/dL   Nitrite POSITIVE (*) NEGATIVE   Leukocytes, UA NEGATIVE  NEGATIVE  URINE MICROSCOPIC-ADD ON     Status: Abnormal   Collection Time    06/28/13  2:20 PM      Result Value Range   Squamous Epithelial / LPF RARE  RARE   WBC, UA 0-2  <3 WBC/hpf   RBC / HPF TOO NUMEROUS TO COUNT  <3 RBC/hpf   Bacteria, UA MANY (*) RARE  CBC     Status: None   Collection Time    06/28/13  2:24 PM      Result Value Range   WBC 8.4  4.0 - 10.5 K/uL   RBC 4.30  3.87 - 5.11 MIL/uL   Hemoglobin 12.5  12.0 - 15.0 g/dL   HCT 16.1  09.6 - 04.5 %   MCV 87.9  78.0 - 100.0 fL   MCH 29.1  26.0 - 34.0 pg   MCHC 33.1  30.0 - 36.0 g/dL   RDW 40.9  81.1 - 91.4 %   Platelets 266  150 - 400 K/uL  POCT PREGNANCY, URINE     Status: None   Collection Time    06/28/13  2:53 PM      Result Value Range   Preg Test, Ur NEGATIVE  NEGATIVE  WET PREP, GENITAL     Status: Abnormal   Collection Time    06/28/13  5:01 PM      Result Value Range   Yeast Wet Prep HPF POC NONE SEEN  NONE SEEN   Trich, Wet Prep NONE SEEN  NONE SEEN   Clue Cells Wet Prep HPF POC NONE SEEN  NONE SEEN   WBC, Wet Prep HPF POC RARE (*) NONE SEEN    US Transvaginal Non-ob  06/28/2013   CLINICAL DATA:  Menorrhagia. Abnormal uterine bleeding. History of ovarian cyst. LMP 06/26/2013  EXAM: TRANSABDOMINAL AND TRANSVAGINAL ULTRASOUND OF PELVIS  TECHNIQUE: Both transabdominal and transvaginal ultrasound examinations of the pelvis were performed. Transabdominal technique  was performed for global imaging of the pelvis including uterus, ovaries, adnexal regions, and pelvic cul-de-sac. It was necessary to proceed with endovaginal exam following the transabdominal exam to visualize the uterus, endometrium, ovaries, and  adnexal regions.  COMPARISON:  None  FINDINGS: Uterus  Measurements: 8.4 x 3.9 x 5.0 cm. No fibroids or other mass visualized.  Endometrium  Thickness: 5.1 mm.  No focal abnormality visualized.  Right ovary  Measurements: 3.2 x 2.7 x 1.7 cm. Normal appearance/no adnexal mass.  Left ovary  Measurements: 2.7 x 1.8 x 1.6 cm. Normal appearance/no adnexal mass.  Other findings  No free fluid.  IMPRESSION: 1. Normal appearance of the uterus/endometrium. 2. If bleeding remains unresponsive to hormonal or medical therapy, sonohysterogram should be considered for focal lesion work-up. (Ref: Radiological Reasoning: Algorithmic Workup of Abnormal Vaginal Bleeding with Endovaginal Sonography and Sonohysterography. AJR 2008; 161:W96-04) 3. Normal appearance of both ovaries. 4. No adnexal mass or free pelvic fluid.   Electronically Signed   By: Rosalie Gums M.D.   On: 06/28/2013 19:09   US Pelvis Complete  06/28/2013   CLINICAL DATA:  Menorrhagia. Abnormal uterine bleeding. History of ovarian cyst. LMP 06/26/2013  EXAM: TRANSABDOMINAL AND TRANSVAGINAL ULTRASOUND OF PELVIS  TECHNIQUE: Both transabdominal and transvaginal ultrasound examinations of the pelvis were performed. Transabdominal technique was performed for global imaging of the pelvis including uterus, ovaries, adnexal regions, and pelvic cul-de-sac. It was necessary to proceed with endovaginal exam following the transabdominal exam to visualize the uterus, endometrium, ovaries, and adnexal regions.  COMPARISON:  None  FINDINGS: Uterus  Measurements: 8.4 x 3.9 x 5.0 cm. No fibroids or other mass visualized.  Endometrium  Thickness: 5.1 mm.  No focal abnormality visualized.  Right ovary  Measurements: 3.2 x 2.7 x 1.7 cm.  Normal appearance/no adnexal mass.  Left ovary  Measurements: 2.7 x 1.8 x 1.6 cm. Normal appearance/no adnexal mass.  Other findings  No free fluid.  IMPRESSION: 1. Normal appearance of the uterus/endometrium. 2. If bleeding remains unresponsive to hormonal or medical therapy, sonohysterogram should be considered for focal lesion work-up. (Ref: Radiological Reasoning: Algorithmic Workup of Abnormal Vaginal Bleeding with Endovaginal Sonography and Sonohysterography. AJR 2008; 540:J81-19) 3. Normal appearance of both ovaries. 4. No adnexal mass or free pelvic fluid.   Electronically Signed   By: Rosalie Gums M.D.   On: 06/28/2013 19:09    MAU Course  Procedures None  MDM UPT - Negative UA, Wet prep, GC/Chlamydia, CBC and [place] today Patient given 60 mg IM Toradol for pain.  Patient requests additional pain medication prior to [place] with reasoning "you are going to kill with during the [place] without pain medicine." 1 mg IM dilaudid given. Patient advised that waiting for additional pain medicine will delay her going to [place] and optimally delay her results and discharge. Patient reports improvement in pain at this time.  Assessment and Plan  A: Abnormal uterine bleeding Dysmenorrhea UTI  P: Discharge home Rx for Percocet and Cipro sent to patient's pharmacy Bleeding precautions discussed Warning signs for pyelonephritis discussed Encouraged increased PO hydration as tolerated Patient referred to [facility] clinic for further evaluation and management of AUB Patient may return to MAU as needed or if her condition were to change or worsen  Freddi Starr, PA-C  06/28/2013, 7:19 PM

## 2013-06-28 NOTE — MAU Note (Signed)
3rd period this month.  Has been having mult periods a month.  Has been having a lot of blood loss, heavy bleeding and clots.  Was dx with an ovarian cyst in 2011.  Having pain in the same area, started last night.

## 2013-06-29 LAB — GC/CHLAMYDIA PROBE AMP
CT Probe RNA: NEGATIVE
GC Probe RNA: NEGATIVE

## 2013-06-29 NOTE — MAU Provider Note (Signed)
Attestation of Attending Supervision of Advanced Practitioner (CNM/NP): Evaluation and management procedures were performed by the Advanced Practitioner under my supervision and collaboration.  I have reviewed the Advanced Practitioner's note and chart, and I agree with the management and plan.  Farrah Skoda 06/29/2013 10:42 AM   

## 2013-08-16 ENCOUNTER — Encounter: Payer: Self-pay | Admitting: Obstetrics & Gynecology

## 2014-05-21 ENCOUNTER — Encounter (HOSPITAL_COMMUNITY): Payer: Self-pay

## 2014-09-11 IMAGING — US US PELVIS COMPLETE
1 series · 13 of 25 positions shown · non-contrast
Comparison: None

CLINICAL DATA: Menorrhagia. Abnormal uterine bleeding. History of
ovarian cyst. LMP 06/26/2013

EXAM:
TRANSABDOMINAL AND TRANSVAGINAL ULTRASOUND OF PELVIS
TECHNIQUE: Both transabdominal and transvaginal ultrasound examinations of the
pelvis were performed. Transabdominal technique was performed for
global imaging of the pelvis including uterus, ovaries, adnexal
regions, and pelvic cul-de-sac. It was necessary to proceed with
endovaginal exam following the transabdominal exam to visualize the
uterus, endometrium, ovaries, and adnexal regions.

[Series 1: us pelvis complete · 13 of 47 slices shown]
[im 1/47]
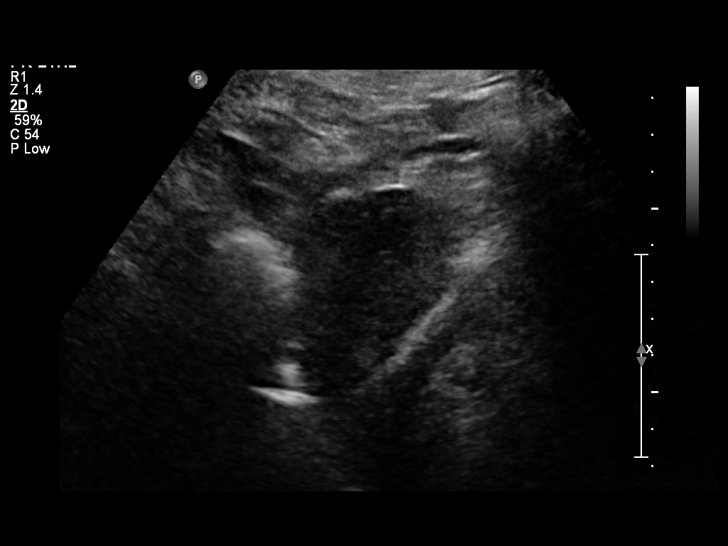
[im 4/47]
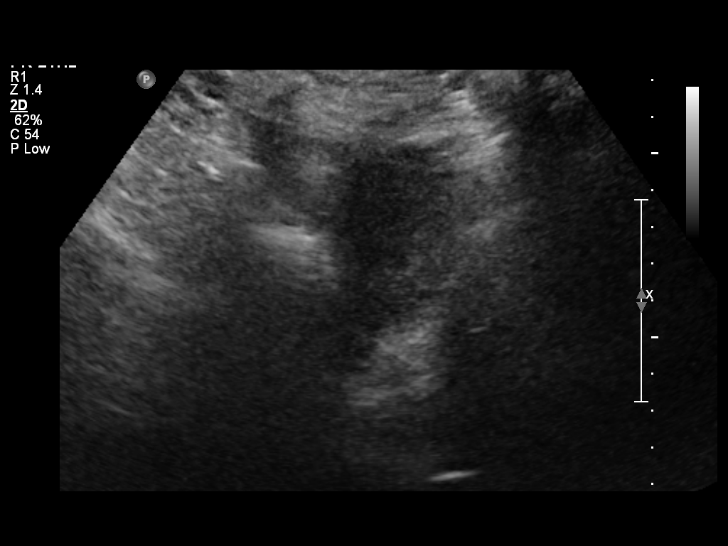
[im 8/47]
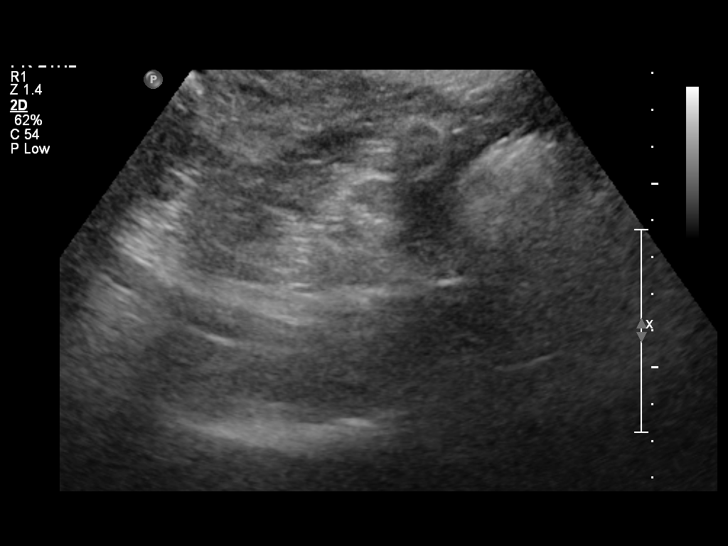
[im 12/47]
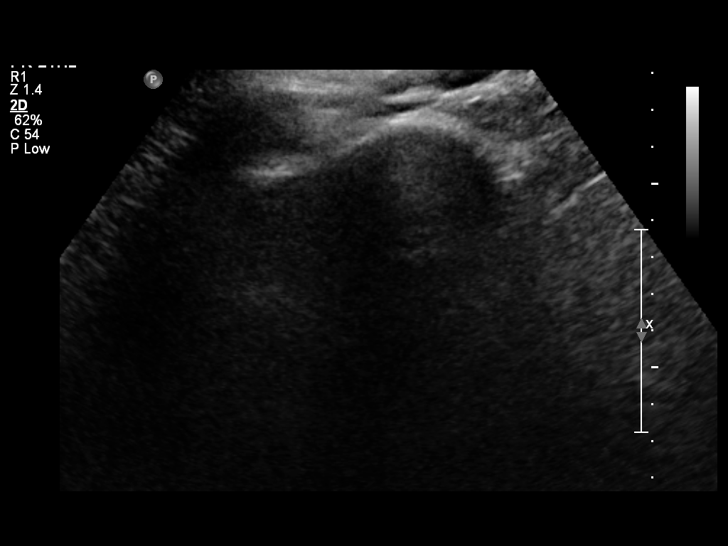
[im 16/47]
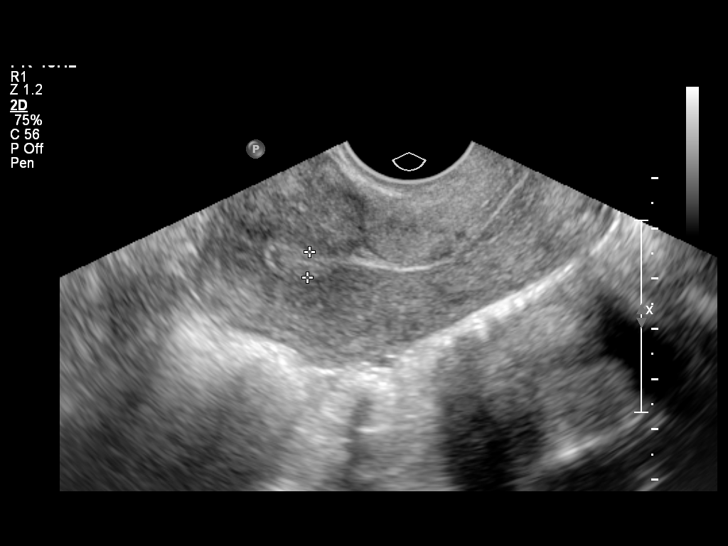
[im 20/47]
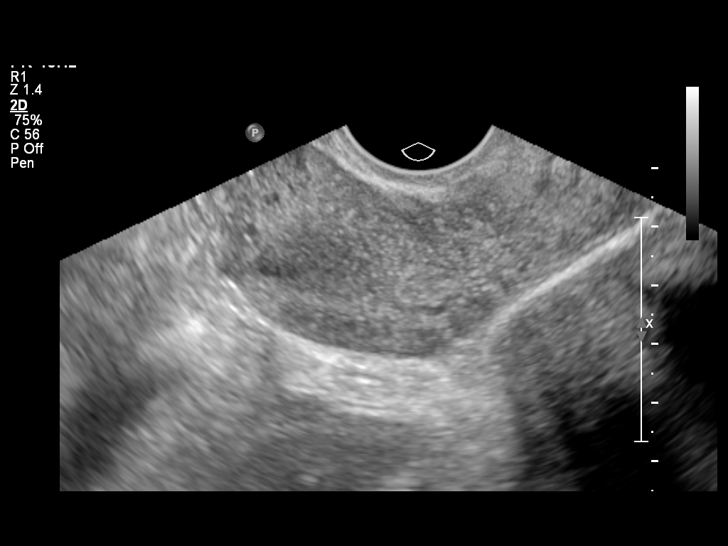
[im 24/47]
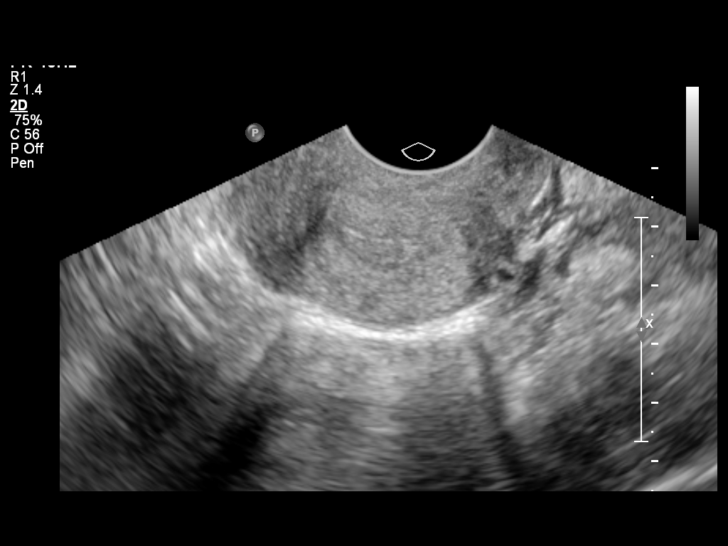
[im 27/47]
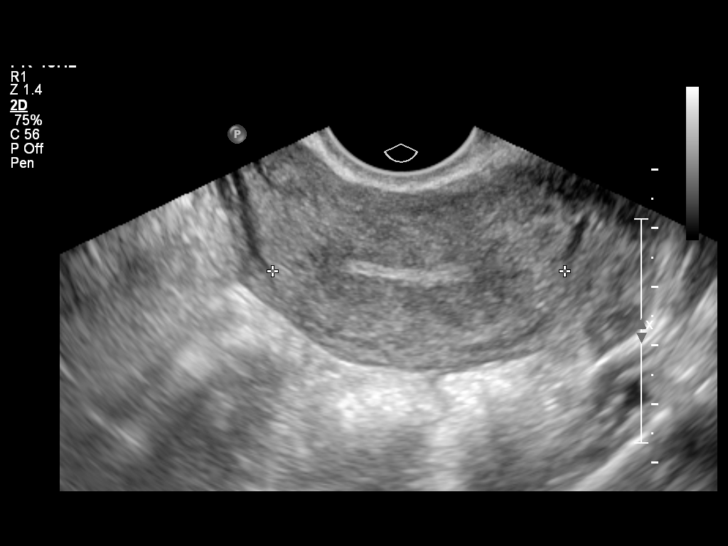
[im 31/47]
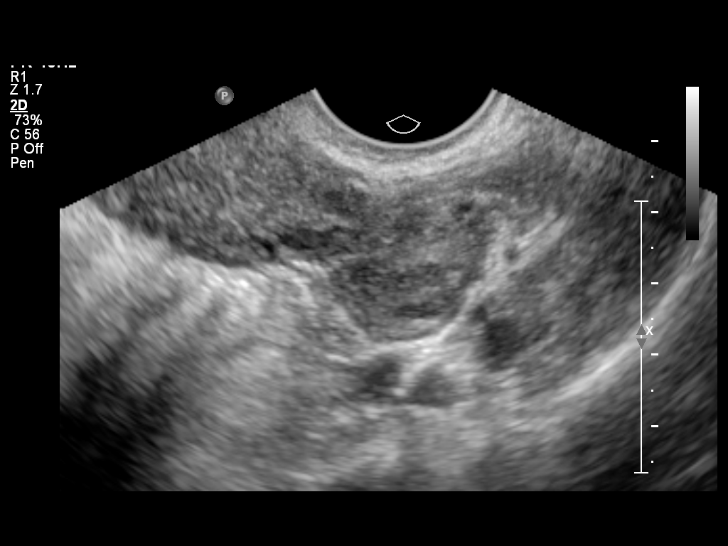
[im 35/47]
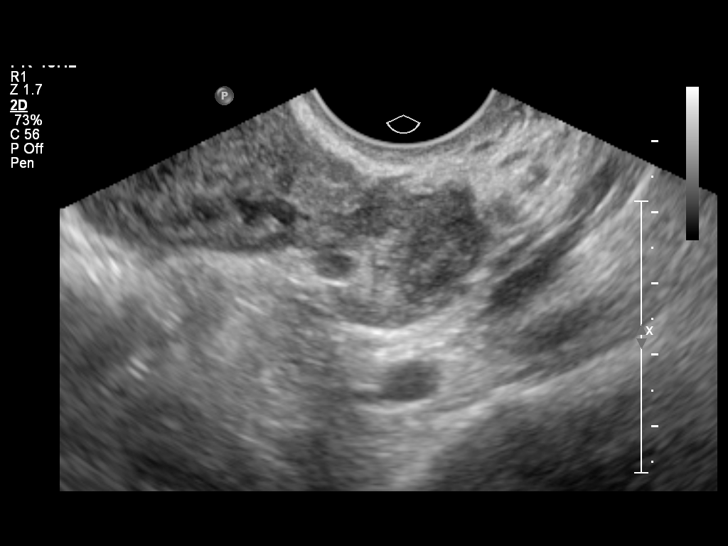
[im 39/47]
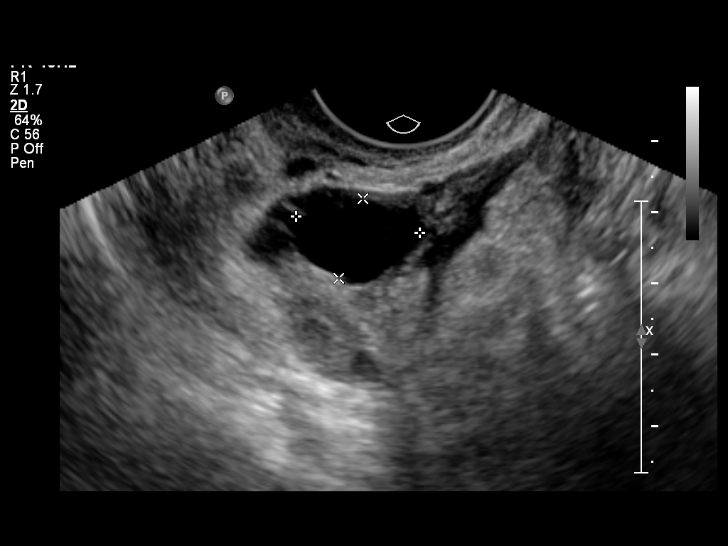
[im 43/47]
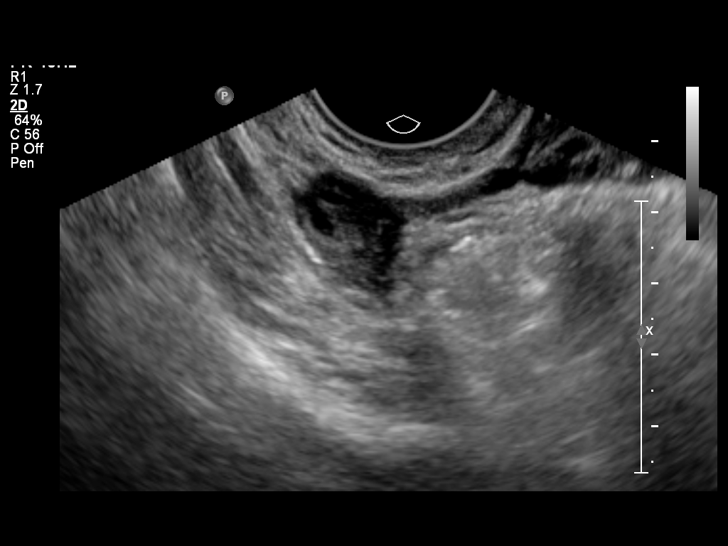
[im 47/47]
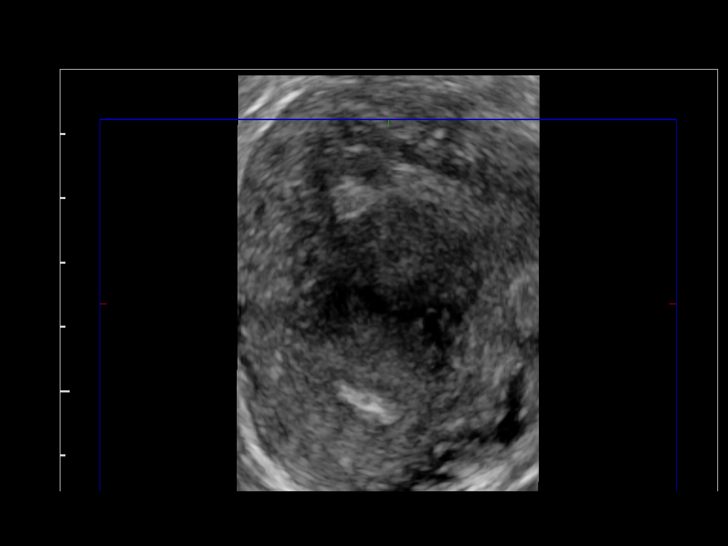

[13 of 25 positions shown; findings below may reference images not displayed]

FINDINGS: Uterus

Measurements: 8.4 x 3.9 x 5.0 cm. No fibroids or other mass
visualized.

Endometrium

Thickness: 5.1 mm.  No focal abnormality visualized.

Right ovary

Measurements: 3.2 x 2.7 x 1.7 cm. Normal appearance/no adnexal mass.

Left ovary

Measurements: 2.7 x 1.8 x 1.6 cm. Normal appearance/no adnexal mass.

Other findings

No free fluid.
IMPRESSION: 1. Normal appearance of the uterus/endometrium.
2. If bleeding remains unresponsive to hormonal or medical therapy,
sonohysterogram should be considered for focal lesion work-up. (Ref:
Radiological Reasoning: Algorithmic Workup of Abnormal Vaginal
Bleeding with Endovaginal Sonography and Sonohysterography. AJR
9773; 191:S68-73)
3. Normal appearance of both ovaries.
4. No adnexal mass or free pelvic fluid.

## 2016-02-24 ENCOUNTER — Emergency Department
Admission: EM | Admit: 2016-02-24 | Discharge: 2016-02-25 | Disposition: A | Discharge status: Home / Self Care | Payer: Self-pay | Attending: Emergency Medicine | Admitting: Emergency Medicine

## 2016-02-24 ENCOUNTER — Encounter: Payer: Self-pay | Admitting: Emergency Medicine

## 2016-02-24 DIAGNOSIS — F329 Major depressive disorder, single episode, unspecified: Secondary | ICD-10-CM | POA: Insufficient documentation

## 2016-02-24 DIAGNOSIS — F17210 Nicotine dependence, cigarettes, uncomplicated: Secondary | ICD-10-CM | POA: Insufficient documentation

## 2016-02-24 DIAGNOSIS — F1994 Other psychoactive substance use, unspecified with psychoactive substance-induced mood disorder: Secondary | ICD-10-CM

## 2016-02-24 DIAGNOSIS — F331 Major depressive disorder, recurrent, moderate: Secondary | ICD-10-CM

## 2016-02-24 DIAGNOSIS — F1410 Cocaine abuse, uncomplicated: Secondary | ICD-10-CM

## 2016-02-24 DIAGNOSIS — F4310 Post-traumatic stress disorder, unspecified: Secondary | ICD-10-CM | POA: Diagnosis present

## 2016-02-24 DIAGNOSIS — F1210 Cannabis abuse, uncomplicated: Secondary | ICD-10-CM | POA: Diagnosis present

## 2016-02-24 DIAGNOSIS — Z79899 Other long term (current) drug therapy: Secondary | ICD-10-CM | POA: Insufficient documentation

## 2016-02-24 DIAGNOSIS — F10920 Alcohol use, unspecified with intoxication, uncomplicated: Secondary | ICD-10-CM

## 2016-02-24 DIAGNOSIS — Z046 Encounter for general psychiatric examination, requested by authority: Secondary | ICD-10-CM

## 2016-02-24 DIAGNOSIS — Z791 Long term (current) use of non-steroidal anti-inflammatories (NSAID): Secondary | ICD-10-CM | POA: Insufficient documentation

## 2016-02-24 DIAGNOSIS — F10120 Alcohol abuse with intoxication, uncomplicated: Secondary | ICD-10-CM | POA: Insufficient documentation

## 2016-02-24 DIAGNOSIS — F1290 Cannabis use, unspecified, uncomplicated: Secondary | ICD-10-CM | POA: Insufficient documentation

## 2016-02-24 DIAGNOSIS — Z792 Long term (current) use of antibiotics: Secondary | ICD-10-CM | POA: Insufficient documentation

## 2016-02-24 LAB — URINE DRUG SCREEN, QUALITATIVE (ARMC ONLY)
Amphetamines, Ur Screen: POSITIVE — AB
BARBITURATES, UR SCREEN: NOT DETECTED
BENZODIAZEPINE, UR SCRN: POSITIVE — AB
CANNABINOID 50 NG, UR ~~LOC~~: POSITIVE — AB
Cocaine Metabolite,Ur ~~LOC~~: POSITIVE — AB
MDMA (Ecstasy)Ur Screen: NOT DETECTED
METHADONE SCREEN, URINE: NOT DETECTED
Opiate, Ur Screen: NOT DETECTED
Phencyclidine (PCP) Ur S: NOT DETECTED
TRICYCLIC, UR SCREEN: NOT DETECTED

## 2016-02-24 LAB — CBC
HCT: 35.4 % (ref 35.0–47.0)
Hemoglobin: 11.9 g/dL — ABNORMAL LOW (ref 12.0–16.0)
MCH: 29.6 pg (ref 26.0–34.0)
MCHC: 33.7 g/dL (ref 32.0–36.0)
MCV: 87.7 fL (ref 80.0–100.0)
PLATELETS: 293 10*3/uL (ref 150–440)
RBC: 4.04 MIL/uL (ref 3.80–5.20)
RDW: 13.7 % (ref 11.5–14.5)
WBC: 9.6 10*3/uL (ref 3.6–11.0)

## 2016-02-24 LAB — COMPREHENSIVE METABOLIC PANEL
ALT: 20 U/L (ref 14–54)
ANION GAP: 9 (ref 5–15)
AST: 20 U/L (ref 15–41)
Albumin: 4.2 g/dL (ref 3.5–5.0)
Alkaline Phosphatase: 52 U/L (ref 38–126)
BUN: 17 mg/dL (ref 6–20)
CHLORIDE: 105 mmol/L (ref 101–111)
CO2: 23 mmol/L (ref 22–32)
CREATININE: 0.9 mg/dL (ref 0.44–1.00)
Calcium: 9.1 mg/dL (ref 8.9–10.3)
Glucose, Bld: 93 mg/dL (ref 65–99)
Potassium: 3.3 mmol/L — ABNORMAL LOW (ref 3.5–5.1)
Sodium: 137 mmol/L (ref 135–145)
Total Bilirubin: 0.7 mg/dL (ref 0.3–1.2)
Total Protein: 7.6 g/dL (ref 6.5–8.1)

## 2016-02-24 LAB — ACETAMINOPHEN LEVEL

## 2016-02-24 LAB — ETHANOL

## 2016-02-24 LAB — SALICYLATE LEVEL

## 2016-02-24 MED ORDER — AMMONIA AROMATIC IN INHA
RESPIRATORY_TRACT | Status: AC
  Administered 2016-02-24: 21:00:00
  Filled 2016-02-24: qty 10

## 2016-02-24 MED ORDER — ALPRAZOLAM 0.5 MG PO TABS
0.5000 mg | ORAL_TABLET | ORAL | Status: AC
  Administered 2016-02-24 – 2016-02-25 (×2): 0.5 mg via ORAL
  Filled 2016-02-24: qty 1

## 2016-02-24 MED ORDER — IBUPROFEN 800 MG PO TABS
ORAL_TABLET | ORAL | Status: AC
  Administered 2016-02-24: 800 mg
  Filled 2016-02-24: qty 1

## 2016-02-24 NOTE — ED Notes (Signed)
Pt calm and cooperative at this time, pt tearful but consolable. Pt co abd cramping states is on her menses rates pain 5/10 at this time. Denies any HI or SI. Pt awaiting TTS consult and psychiatry consult.

## 2016-02-24 NOTE — ED Provider Notes (Signed)
[facility] Emergency Department Provider Note   ____________________________________________   First MD Initiated Contact with Patient 02/24/16 1926     (approximate)  I have reviewed the triage vital signs and the nursing notes.   HISTORY  Chief Complaint Medical Clearance and Depression    HPI Penny Mata is a 40 y.o. female presents for evaluation of making suicidal statements to the police.  The patient is under involuntary commitment. She reports that she was upset because her mom and been evicted, and that she has been having severe depression. She told the police that she had thoughts of suicide, but she reports to me that she wasn't being it that she just simply wanted to talk to someone about it.  She denies any medical issues. She does take Cymbalta and Xanax. She reports that she is under the care of psychiatrist in [facility], and just moved to [place].  No nausea vomiting. Denies pregnancy. No fevers or chills. She is currently on her menstrual period and request some ibuprofen for cramps.   Past Medical History:  Diagnosis Date  . Alcohol abuse   . Anxiety   . Depression   . Ovarian cyst     Patient Active Problem List   Diagnosis Date Noted  . PTSD (post-traumatic stress disorder) 09/09/2012  . Benzodiazepine abuse 09/09/2012  . Marijuana abuse 09/09/2012    Past Surgical History:  Procedure Laterality Date  . CESAREAN SECTION    . TOTAL KNEE ARTHROPLASTY      Prior to Admission medications   Medication Sig Start Date End Date Taking? Authorizing Provider  acetaminophen (TYLENOL) 500 MG tablet Take 1,000 mg by mouth every 6 (six) hours as needed. For pain    Historical Provider, MD  ALPRAZolam Prudy Feeler) 0.5 MG tablet Take 0.5 mg by mouth at bedtime as needed for anxiety.    Historical Provider, MD  ciprofloxacin (CIPRO) 250 MG tablet Take 1 tablet (250 mg total) by mouth every 12 (twelve) hours. 06/28/13    Marny Lowenstein, PA-C  DULoxetine (CYMBALTA) 30 MG capsule Take 1 capsule (30 mg total) by mouth daily. 09/10/12   Yolande Jolly, PA-C  ibuprofen (ADVIL,MOTRIN) 200 MG tablet Take 400 mg by mouth every 6 (six) hours as needed. For pain    Historical Provider, MD  oxyCODONE-acetaminophen (PERCOCET/ROXICET) 5-325 MG per tablet Take 1-2 tablets by mouth every 4 (four) hours as needed for severe pain. 06/28/13   Marny Lowenstein, PA-C    Allergies Review of patient's allergies indicates no known allergies.  Family History  Problem Relation Age of Onset  . Depression Mother   . Anxiety disorder Father   . Depression Father   . Alcohol abuse Father   . Drug abuse Father   . Depression Sister     Social History Social History  Substance Use Topics  . Smoking status: Current Every Day Smoker    Packs/day: 0.50    Types: Cigarettes  . Smokeless tobacco: Never Used  . Alcohol use No    Review of Systems Denies overdose or ingestion. Denies hallucinations.  Constitutional: No fever/chills Eyes: No visual changes. ENT: No sore throat. Cardiovascular: Denies chest pain. Respiratory: Denies shortness of breath. Gastrointestinal: No abdominal pain.  No nausea, no vomiting.  No diarrhea.  No constipation. Occasional cramping due to being on her "period". Genitourinary: Negative for dysuria. Musculoskeletal: Negative for back pain. Skin: Negative for rash. Neurological: Negative for headaches, focal weakness or numbness.  10-point  ROS otherwise negative.  ____________________________________________   PHYSICAL EXAM:  VITAL SIGNS: ED Triage Vitals [02/24/16 1801]  Enc Vitals Group     BP 136/79     Pulse Rate 93     Resp 20     Temp 98.1 F (36.7 C)     Temp Source Oral     SpO2 95 %     Weight 160 lb (72.6 kg)     Height 5\' 5"  (1.651 m)     Head Circumference      Peak Flow      Pain Score      Pain Loc      Pain Edu?      Excl. in GC?      Constitutional: Alert and  oriented. Well appearing and in no acute distress. Eyes: Conjunctivae are normal. PERRL. EOMI. Head: Atraumatic. Nose: No congestion/rhinnorhea. Mouth/Throat: Mucous membranes are moist.  Oropharynx non-erythematous. Neck: No stridor.   Cardiovascular: Normal rate, regular rhythm. Grossly normal heart sounds.  Good peripheral circulation. Respiratory: Normal respiratory effort.  No retractions. Lungs CTAB. Gastrointestinal: Soft and nontender. No distention. Musculoskeletal: No lower extremity tenderness nor edema.  No joint effusions. Neurologic:  Normal speech and language. No gross focal neurologic deficits are appreciated. No gait instability. Skin:  Skin is warm, dry and intact. No rash noted. Psychiatric: Mood and affect are slightly anxious and the little tearful. Speech and behavior are normal. Reports she's been having suicidal thoughts, but she denies a plan to "jump off a bridge".  ____________________________________________   LABS (all labs ordered are listed, but only abnormal results are displayed)  Labs Reviewed  COMPREHENSIVE METABOLIC PANEL - Abnormal; Notable for the following:       Result Value   Potassium 3.3 (*)    All other components within normal limits  ACETAMINOPHEN LEVEL - Abnormal; Notable for the following:    Acetaminophen (Tylenol), Serum <10 (*)    All other components within normal limits  CBC - Abnormal; Notable for the following:    Hemoglobin 11.9 (*)    All other components within normal limits  ETHANOL  SALICYLATE LEVEL  URINE DRUG SCREEN, QUALITATIVE ([facility] ONLY)   ____________________________________________  EKG   ____________________________________________  RADIOLOGY   ____________________________________________   PROCEDURES  Procedure(s) performed: None  Procedures  Critical Care performed: No  ____________________________________________   INITIAL IMPRESSION / ASSESSMENT AND PLAN / ED COURSE  Pertinent labs &  imaging results that were available during my care of the patient were reviewed by me and considered in my medical decision making (see chart for details).  Patient under involuntary commitment. Denies acute medical condition and appears stable. Labs reviewed, no acute emergent condition identified on medical screening. Consult placed to psychiatry.  Clinical Course     ____________________________________________   FINAL CLINICAL IMPRESSION(S) / ED DIAGNOSES  Final diagnoses:  None      NEW MEDICATIONS STARTED DURING THIS VISIT:  New Prescriptions   No medications on file     Note:  This document was prepared using Dragon voice recognition software and may include unintentional dictation errors.     Sharyn CreamerMark Quale, MD 02/24/16 2007

## 2016-02-24 NOTE — ED Notes (Signed)
TTS in to speak with patient, pt seems to be sleeping and will not wake up when aroused, ammonia inhalant used with positive response.

## 2016-02-24 NOTE — ED Triage Notes (Signed)
Brought in under IVC  States she has been very depressed d/t home situation  Has been having thought s odf SI   But no plan

## 2016-02-24 NOTE — ED Notes (Signed)
[facility]

## 2016-02-24 NOTE — ED Notes (Signed)

## 2016-02-24 NOTE — ED Notes (Signed)
BEHAVIORAL HEALTH ROUNDING Patient sleeping: No. Patient alert and oriented: yes Behavior appropriate: Yes.  ; If no, describe:  Nutrition and fluids offered: Yes  Toileting and hygiene offered: Yes  Sitter present: not applicable Law enforcement present: Yes  

## 2016-02-24 NOTE — ED Notes (Signed)
Patient observed with no unusual behavior or acute distress. Patient with no verbalized needs or c/o at this time.... will continue to monitor and follow up as needed. Security staff monitoring patient.  

## 2016-02-24 NOTE — ED Notes (Signed)
Patient observed with no unusual behavior or acute distress. Patient with no verbalized needs or c/o at this time.... will continue to monitor and follow up as needed. Security staff monitoring patient on [employer] system.  

## 2016-02-24 NOTE — Progress Notes (Signed)
TTS attempted to conduct assessment.  Patient was unable to be aroused.

## 2016-02-24 NOTE — ED Notes (Signed)
Report received from Kim RN. Patient care assumed. Patient/RN introduction complete. Will continue to monitor.  

## 2016-02-25 DIAGNOSIS — F331 Major depressive disorder, recurrent, moderate: Secondary | ICD-10-CM

## 2016-02-25 DIAGNOSIS — F1410 Cocaine abuse, uncomplicated: Secondary | ICD-10-CM

## 2016-02-25 DIAGNOSIS — Z046 Encounter for general psychiatric examination, requested by authority: Secondary | ICD-10-CM

## 2016-02-25 DIAGNOSIS — F1994 Other psychoactive substance use, unspecified with psychoactive substance-induced mood disorder: Secondary | ICD-10-CM

## 2016-02-25 MED ORDER — ALPRAZOLAM 0.5 MG PO TABS
ORAL_TABLET | ORAL | Status: AC
  Administered 2016-02-25: 0.5 mg via ORAL
  Filled 2016-02-25: qty 1

## 2016-02-25 MED ORDER — IBUPROFEN 800 MG PO TABS
800.0000 mg | ORAL_TABLET | Freq: Once | ORAL | Status: AC
  Administered 2016-02-25: 800 mg via ORAL
  Filled 2016-02-25: qty 1
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
## 2016-02-25 NOTE — ED Provider Notes (Signed)
-----------------------------------------   11:35 AM on 02/25/2016 -----------------------------------------   Blood pressure (!) 99/55, pulse 74, temperature 98.4 F (36.9 C), temperature source Oral, resp. rate 16, height 5\' 5"  (1.651 m), weight 160 lb (72.6 kg), last menstrual period 01/24/2016, SpO2 99 %.  Patient has been cleared by psychiatry for discharge. Patient is sober and calm this morning. Denies suicidal or homicidal ideation. Patient is followed by Dr. Lafayette Dragonarr in [place] and has close follow-up. Labs and vital reviewed prior to d/c.   Penny Sicklearolina Taahir Grisby, MD 02/25/16 1136

## 2016-02-25 NOTE — ED Notes (Signed)
Patient received breakfast tray 
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
## 2016-02-25 NOTE — ED Notes (Signed)
[facility] to room. Patient is tearful, crying asking this RN, "can I sign myself out?". This RN advised patient that she was admitted and that she could not sign herself out at this time.Patient verbalized understanding. Patient denies SI, HI. Patient made aware of security cameras and Q15 minute rounds. Patient encouraged to let nursing staff know of any concerns or needs.

## 2016-02-25 NOTE — ED Provider Notes (Signed)
-----------------------------------------   6:53 AM on 02/25/2016 -----------------------------------------   Blood pressure (!) 99/55, pulse 74, temperature 98.4 F (36.9 C), temperature source Oral, resp. rate 16, height 5\' 5"  (1.651 m), weight 160 lb (72.6 kg), last menstrual period 01/24/2016, SpO2 99 %.  The patient had no acute events since last update.  Calm and cooperative at this time.  Disposition is pending per Psychiatry/Behavioral Medicine team recommendations.     [name], MD 02/25/16 [phone]
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
## 2016-02-25 NOTE — ED Notes (Signed)
Patient asleep in room. No noted distress or abnormal behavior. Will continue 15 minute checks and observation by security cameras for safety. 

## 2016-02-25 NOTE — ED Notes (Signed)
Psychiatrist currently at bedside.

## 2016-02-25 NOTE — Progress Notes (Signed)
Patient remains calm and cooperative. No issues to report this shift. Maintained on 15 minute checks and observation by security camera for safety. 

## 2016-02-25 NOTE — ED Notes (Signed)
ENVIRONMENTAL ASSESSMENT Potentially harmful objects out of patient reach: Yes Personal belongings secured: Yes Patient dressed in hospital provided attire only: Yes Plastic bags out of patient reach: Yes Patient care equipment (cords, cables, call bells, lines, and drains) shortened, removed, or accounted for: Yes Equipment and supplies removed from bottom of stretcher: Yes Potentially toxic materials out of patient reach: Yes Sharps container removed or out of patient reach: Yes  Patient currently in room sleeping. No signs of distress noted. Maintained on 15 minute checks and observation by security camera for safety.  
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
## 2016-02-25 NOTE — ED Notes (Signed)
Report given to Ruth, RN at this time. 

## 2016-02-25 NOTE — ED Notes (Signed)
Patient denies SI/HI/AVH and pain. All belongings returned to patient. Discharge instructions reviewed. Patient discharged to home.

## 2016-02-25 NOTE — ED Notes (Signed)
Pt moved to ED BHU

## 2016-02-25 NOTE — ED Notes (Signed)
Patient observed with no unusual behavior or acute distress. Patient with no verbalized needs or c/o at this time.... will continue to monitor and follow up as needed. Security staff monitoring patient.  

## 2017-04-10 ENCOUNTER — Inpatient Hospital Stay (HOSPITAL_COMMUNITY)
Admission: AD | Admit: 2017-04-10 | Discharge: 2017-04-10 | Disposition: A | Discharge status: Home / Self Care | Payer: Self-pay | Source: Ambulatory Visit | Attending: Obstetrics and Gynecology | Admitting: Obstetrics and Gynecology

## 2017-04-10 ENCOUNTER — Encounter (HOSPITAL_COMMUNITY): Payer: Self-pay | Admitting: *Deleted

## 2017-04-10 DIAGNOSIS — N3001 Acute cystitis with hematuria: Secondary | ICD-10-CM | POA: Insufficient documentation

## 2017-04-10 DIAGNOSIS — K029 Dental caries, unspecified: Secondary | ICD-10-CM | POA: Insufficient documentation

## 2017-04-10 DIAGNOSIS — N939 Abnormal uterine and vaginal bleeding, unspecified: Secondary | ICD-10-CM | POA: Insufficient documentation

## 2017-04-10 DIAGNOSIS — Z3202 Encounter for pregnancy test, result negative: Secondary | ICD-10-CM | POA: Insufficient documentation

## 2017-04-10 DIAGNOSIS — Z113 Encounter for screening for infections with a predominantly sexual mode of transmission: Secondary | ICD-10-CM | POA: Insufficient documentation

## 2017-04-10 DIAGNOSIS — F17210 Nicotine dependence, cigarettes, uncomplicated: Secondary | ICD-10-CM | POA: Insufficient documentation

## 2017-04-10 DIAGNOSIS — K0889 Other specified disorders of teeth and supporting structures: Secondary | ICD-10-CM | POA: Insufficient documentation

## 2017-04-10 LAB — CBC
HCT: 36 % (ref 36.0–46.0)
HEMOGLOBIN: 12.2 g/dL (ref 12.0–15.0)
MCH: 29.8 pg (ref 26.0–34.0)
MCHC: 33.9 g/dL (ref 30.0–36.0)
MCV: 87.8 fL (ref 78.0–100.0)
PLATELETS: 281 10*3/uL (ref 150–400)
RBC: 4.1 MIL/uL (ref 3.87–5.11)
RDW: 14.3 % (ref 11.5–15.5)
WBC: 6 10*3/uL (ref 4.0–10.5)

## 2017-04-10 LAB — URINALYSIS, ROUTINE W REFLEX MICROSCOPIC
Bilirubin Urine: NEGATIVE
GLUCOSE, UA: NEGATIVE mg/dL
KETONES UR: NEGATIVE mg/dL
NITRITE: POSITIVE — AB
PH: 5 (ref 5.0–8.0)
PROTEIN: NEGATIVE mg/dL
Specific Gravity, Urine: 1.027 (ref 1.005–1.030)

## 2017-04-10 LAB — WET PREP, GENITAL
Sperm: NONE SEEN
TRICH WET PREP: NONE SEEN
YEAST WET PREP: NONE SEEN

## 2017-04-10 LAB — POCT PREGNANCY, URINE: PREG TEST UR: NEGATIVE

## 2017-04-10 MED ORDER — AMOXICILLIN-POT CLAVULANATE 875-125 MG PO TABS: 1.0000 | ORAL_TABLET | Freq: Two times a day (BID) | ORAL | 0 refills | Status: DC

## 2017-04-10 MED ORDER — SULFAMETHOXAZOLE-TRIMETHOPRIM 800-160 MG PO TABS: 1.0000 | ORAL_TABLET | Freq: Two times a day (BID) | ORAL | 0 refills | Status: DC

## 2017-04-10 NOTE — MAU Note (Signed)
I have been bleeding for 3 wks out of 4 for last yr and half. Tooth pain lower left. Hurts into ear

## 2017-04-10 NOTE — Progress Notes (Signed)
Written and verbal d/c instructions given by Caprice Renshaw RN and pt voiced understanding
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
## 2017-04-10 NOTE — Discharge Instructions (Signed)
Abnormal Uterine Bleeding Abnormal uterine bleeding can affect women at various stages in life, including teenagers, women in their reproductive years, pregnant women, and women who have reached menopause. Several kinds of uterine bleeding are considered abnormal, including:  Bleeding or spotting between periods.  Bleeding after sexual intercourse.  Bleeding that is heavier or more than normal.  Periods that last longer than usual.  Bleeding after menopause.  Many cases of abnormal uterine bleeding are minor and simple to treat, while others are more serious. Any type of abnormal bleeding should be evaluated by your health care provider. Treatment will depend on the cause of the bleeding. Follow these instructions at home: Monitor your condition for any changes. The following actions may help to alleviate any discomfort you are experiencing:  Avoid the use of tampons and douches as directed by your health care provider.  Change your pads frequently.  You should get regular pelvic exams and Pap tests. Keep all follow-up appointments for diagnostic tests as directed by your health care provider. Contact a health care provider if:  Your bleeding lasts more than 1 week.  You feel dizzy at times. Get help right away if:  You pass out.  You are changing pads every 15 to 30 minutes.  You have abdominal pain.  You have a fever.  You become sweaty or weak.  You are passing large blood clots from the vagina.  You start to feel nauseous and vomit. This information is not intended to replace advice given to you by your health care provider. Make sure you discuss any questions you have with your health care provider. Document Released: 07/06/2005 Document Revised: 12/18/2015 Document Reviewed: 02/02/2013 Elsevier Interactive Patient Education  2017 [employer]   Dental Caries Dental caries are spots of decay (cavities) in teeth. They are in the outer layer of your tooth  (enamel). Treat them as soon as you can. If they are not treated, they can spread decay and lead to painful infection. Follow these instructions at home: General instructions  Take good care of your mouth and teeth. This keeps them healthy. ? Brush your teeth 2 times a day. Use toothpaste with fluoride in it. ? Floss your teeth once a day.  If your dentist prescribed an antibiotic medicine to treat an infection, take it as told. Do not stop taking the antibiotic even if your condition gets better.  Keep all follow-up visits as told by your dentist. This is important. This includes all cleanings. Preventing dental caries  Brush your teeth every morning and night. Use fluoride toothpaste.  Get regular dental cleanings.  If you are at risk of dental caries. ? Wash your mouth with prescription mouthwash (chlorhexidine). ? Put topical fluoride on your teeth.  Drink water with fluoride in it.  Drink water instead of sugary drinks.  Eat healthy meals and snacks. Contact a doctor if:  You have symptoms of tooth decay. Summary  Dental caries are spots of decay (cavities) in teeth. They are in the outer layer of your tooth.  Take an antibiotic to treat an infection, if told by your dentist. Do not stop taking the antibiotic even if your condition gets better.  Regular dental cleanings and brushing can help prevent dental caries. This information is not intended to replace advice given to you by your health care provider. Make sure you discuss any questions you have with your health care provider. Document Released: 04/14/2008 Document Revised: 03/22/2016 Document Reviewed: 03/22/2016 Elsevier Interactive Patient Education  2017  [employer] Inc[employer]  Urinary Tract Infection, Adult A urinary tract infection (UTI) is an infection of any part of the urinary tract, which includes the kidneys, ureters, bladder, and urethra. These organs make, store, and get rid of urine in the body. UTI can be a  bladder infection (cystitis) or kidney infection (pyelonephritis). What are the causes? This infection may be caused by fungi, viruses, or bacteria. Bacteria are the most common cause of UTIs. This condition can also be caused by repeated incomplete emptying of the bladder during urination. What increases the risk? This condition is more likely to develop if:  You ignore your need to urinate or hold urine for long periods of time.  You do not empty your bladder completely during urination.  You wipe back to front after urinating or having a bowel movement, if you are female.  You are uncircumcised, if you are female.  You are constipated.  You have a urinary catheter that stays in place (indwelling).  You have a weak defense (immune) system.  You have a medical condition that affects your bowels, kidneys, or bladder.  You have diabetes.  You take antibiotic medicines frequently or for long periods of time, and the antibiotics no longer work well against certain types of infections (antibiotic resistance).  You take medicines that irritate your urinary tract.  You are exposed to chemicals that irritate your urinary tract.  You are female.  What are the signs or symptoms? Symptoms of this condition include:  Fever.  Frequent urination or passing small amounts of urine frequently.  Needing to urinate urgently.  Pain or burning with urination.  Urine that smells bad or unusual.  Cloudy urine.  Pain in the lower abdomen or back.  Trouble urinating.  Blood in the urine.  Vomiting or being less hungry than normal.  Diarrhea or abdominal pain.  Vaginal discharge, if you are female.  How is this diagnosed? This condition is diagnosed with a medical history and physical exam. You will also need to provide a urine sample to test your urine. Other tests may be done, including:  Blood tests.  Sexually transmitted disease (STD) testing.  If you have had more than one  UTI, a cystoscopy or imaging studies may be done to determine the cause of the infections. How is this treated? Treatment for this condition often includes a combination of two or more of the following:  Antibiotic medicine.  Other medicines to treat less common causes of UTI.  Over-the-counter medicines to treat pain.  Drinking enough water to stay hydrated.  Follow these instructions at home:  Take over-the-counter and prescription medicines only as told by your health care provider.  If you were prescribed an antibiotic, take it as told by your health care provider. Do not stop taking the antibiotic even if you start to feel better.  Avoid alcohol, caffeine, tea, and carbonated beverages. They can irritate your bladder.  Drink enough fluid to keep your urine clear or pale yellow.  Keep all follow-up visits as told by your health care provider. This is important.  Make sure to: ? Empty your bladder often and completely. Do not hold urine for long periods of time. ? Empty your bladder before and after sex. ? Wipe from front to back after a bowel movement if you are female. Use each tissue one time when you wipe. Contact a health care provider if:  You have back pain.  You have a fever.  You feel nauseous or vomit.  Your symptoms do not get better after 3 days.  Your symptoms go away and then return. Get help right away if:  You have severe back pain or lower abdominal pain.  You are vomiting and cannot keep down any medicines or water. This information is not intended to replace advice given to you by your health care provider. Make sure you discuss any questions you have with your health care provider. Document Released: 04/15/2005 Document Revised: 12/18/2015 Document Reviewed: 05/27/2015 Elsevier Interactive Patient Education  2017 [employer].

## 2017-04-11 LAB — RPR: RPR: NONREACTIVE

## 2017-04-11 LAB — HIV ANTIBODY (ROUTINE TESTING W REFLEX): HIV SCREEN 4TH GENERATION: NONREACTIVE

## 2017-04-12 ENCOUNTER — Telehealth (HOSPITAL_COMMUNITY): Payer: Self-pay

## 2017-04-12 LAB — GC/CHLAMYDIA PROBE AMP (~~LOC~~) NOT AT ARMC
Chlamydia: POSITIVE — AB
Neisseria Gonorrhea: NEGATIVE

## 2017-04-12 NOTE — Telephone Encounter (Signed)
Pt called to check on her HIV test results from 9/22. Verified name and DOB. Told pt results of her HIV test were non reactive/normal.

## 2017-04-15 ENCOUNTER — Telehealth: Payer: Self-pay | Admitting: Student

## 2017-04-15 DIAGNOSIS — A749 Chlamydial infection, unspecified: Secondary | ICD-10-CM

## 2017-04-15 MED ORDER — AZITHROMYCIN 500 MG PO TABS: 1000.0000 mg | ORAL_TABLET | Freq: Once | ORAL | 0 refills | Status: AC

## 2017-04-15 NOTE — Telephone Encounter (Addendum)
Penny Mata tested positive for  Chlamydia. Patient was called by RN and allergies and pharmacy confirmed. Rx sent to pharmacy of choice.   Judeth Horn, NP 04/15/2017 4:32 PM          ----- Message from Kathe Becton, RN sent at 04/15/2017  2:19 PM EDT ----- This patient tested positive for::"Chlamydia",  She :"has NKDA", I have informed the patient of her results and confirmed her pharmacy is correct in her chart. Please send Rx.   Thank you,   Kathe Becton, RN  Results faxed to [facility] Department.  Raynelle Fanning, I sent you a message that I was unable to contact this pt.  She called me back and I verified her Pharmacy and Allergies.  Thank you, Lawson Fiscal

## 2017-04-21 ENCOUNTER — Encounter: Payer: Self-pay | Admitting: Obstetrics & Gynecology

## 2017-04-23 ENCOUNTER — Ambulatory Visit (HOSPITAL_COMMUNITY): Payer: Self-pay | Attending: Student

## 2017-04-26 ENCOUNTER — Encounter: Payer: Self-pay | Admitting: Obstetrics & Gynecology

## 2017-11-10 ENCOUNTER — Emergency Department (HOSPITAL_COMMUNITY)
Admission: EM | Admit: 2017-11-10 | Discharge: 2017-11-10 | Discharge status: Against Medical Advice | Payer: Self-pay | Attending: Emergency Medicine | Admitting: Emergency Medicine

## 2017-11-10 DIAGNOSIS — Z5321 Procedure and treatment not carried out due to patient leaving prior to being seen by health care provider: Secondary | ICD-10-CM | POA: Insufficient documentation

## 2018-08-27 ENCOUNTER — Inpatient Hospital Stay (HOSPITAL_COMMUNITY)
Admission: EM | Admit: 2018-08-27 | Discharge: 2018-08-28 | DRG: 917 | Disposition: A | Discharge status: Home / Self Care | Payer: Self-pay | Attending: Family Medicine | Admitting: Family Medicine

## 2018-08-27 ENCOUNTER — Encounter (HOSPITAL_COMMUNITY): Payer: Self-pay

## 2018-08-27 ENCOUNTER — Other Ambulatory Visit: Payer: Self-pay

## 2018-08-27 ENCOUNTER — Emergency Department (HOSPITAL_COMMUNITY): Payer: Self-pay

## 2018-08-27 DIAGNOSIS — K029 Dental caries, unspecified: Secondary | ICD-10-CM | POA: Diagnosis present

## 2018-08-27 DIAGNOSIS — Z79899 Other long term (current) drug therapy: Secondary | ICD-10-CM

## 2018-08-27 DIAGNOSIS — H60501 Unspecified acute noninfective otitis externa, right ear: Secondary | ICD-10-CM | POA: Diagnosis present

## 2018-08-27 DIAGNOSIS — Z96659 Presence of unspecified artificial knee joint: Secondary | ICD-10-CM | POA: Diagnosis present

## 2018-08-27 DIAGNOSIS — F1310 Sedative, hypnotic or anxiolytic abuse, uncomplicated: Secondary | ICD-10-CM | POA: Diagnosis present

## 2018-08-27 DIAGNOSIS — F1510 Other stimulant abuse, uncomplicated: Secondary | ICD-10-CM | POA: Diagnosis present

## 2018-08-27 DIAGNOSIS — F1010 Alcohol abuse, uncomplicated: Secondary | ICD-10-CM | POA: Diagnosis present

## 2018-08-27 DIAGNOSIS — F329 Major depressive disorder, single episode, unspecified: Secondary | ICD-10-CM | POA: Diagnosis present

## 2018-08-27 DIAGNOSIS — Z87440 Personal history of urinary (tract) infections: Secondary | ICD-10-CM

## 2018-08-27 DIAGNOSIS — F1210 Cannabis abuse, uncomplicated: Secondary | ICD-10-CM | POA: Diagnosis present

## 2018-08-27 DIAGNOSIS — Z811 Family history of alcohol abuse and dependence: Secondary | ICD-10-CM

## 2018-08-27 DIAGNOSIS — F419 Anxiety disorder, unspecified: Secondary | ICD-10-CM | POA: Diagnosis present

## 2018-08-27 DIAGNOSIS — R9431 Abnormal electrocardiogram [ECG] [EKG]: Secondary | ICD-10-CM | POA: Diagnosis present

## 2018-08-27 DIAGNOSIS — T401X1A Poisoning by heroin, accidental (unintentional), initial encounter: Principal | ICD-10-CM | POA: Diagnosis present

## 2018-08-27 DIAGNOSIS — Z6841 Body Mass Index (BMI) 40.0 and over, adult: Secondary | ICD-10-CM

## 2018-08-27 DIAGNOSIS — F909 Attention-deficit hyperactivity disorder, unspecified type: Secondary | ICD-10-CM | POA: Diagnosis present

## 2018-08-27 DIAGNOSIS — F17210 Nicotine dependence, cigarettes, uncomplicated: Secondary | ICD-10-CM | POA: Diagnosis present

## 2018-08-27 DIAGNOSIS — F1910 Other psychoactive substance abuse, uncomplicated: Secondary | ICD-10-CM | POA: Diagnosis present

## 2018-08-27 DIAGNOSIS — J9601 Acute respiratory failure with hypoxia: Secondary | ICD-10-CM | POA: Diagnosis present

## 2018-08-27 DIAGNOSIS — N390 Urinary tract infection, site not specified: Secondary | ICD-10-CM | POA: Diagnosis present

## 2018-08-27 DIAGNOSIS — F4310 Post-traumatic stress disorder, unspecified: Secondary | ICD-10-CM | POA: Diagnosis present

## 2018-08-27 DIAGNOSIS — E876 Hypokalemia: Secondary | ICD-10-CM | POA: Diagnosis present

## 2018-08-27 DIAGNOSIS — Z818 Family history of other mental and behavioral disorders: Secondary | ICD-10-CM

## 2018-08-27 DIAGNOSIS — E669 Obesity, unspecified: Secondary | ICD-10-CM | POA: Diagnosis present

## 2018-08-27 DIAGNOSIS — T50901A Poisoning by unspecified drugs, medicaments and biological substances, accidental (unintentional), initial encounter: Secondary | ICD-10-CM | POA: Diagnosis present

## 2018-08-27 DIAGNOSIS — Z813 Family history of other psychoactive substance abuse and dependence: Secondary | ICD-10-CM

## 2018-08-27 DIAGNOSIS — F1110 Opioid abuse, uncomplicated: Secondary | ICD-10-CM | POA: Diagnosis present

## 2018-08-27 DIAGNOSIS — T402X1A Poisoning by other opioids, accidental (unintentional), initial encounter: Secondary | ICD-10-CM | POA: Diagnosis present

## 2018-08-27 LAB — CBC WITH DIFFERENTIAL/PLATELET
ABS IMMATURE GRANULOCYTES: 0.11 10*3/uL — AB (ref 0.00–0.07)
BASOS PCT: 0 %
Basophils Absolute: 0 10*3/uL (ref 0.0–0.1)
EOS PCT: 2 %
Eosinophils Absolute: 0.3 10*3/uL (ref 0.0–0.5)
HCT: 35.7 % — ABNORMAL LOW (ref 36.0–46.0)
Hemoglobin: 11.2 g/dL — ABNORMAL LOW (ref 12.0–15.0)
Immature Granulocytes: 1 %
Lymphocytes Relative: 9 %
Lymphs Abs: 1.2 10*3/uL (ref 0.7–4.0)
MCH: 29.8 pg (ref 26.0–34.0)
MCHC: 31.4 g/dL (ref 30.0–36.0)
MCV: 94.9 fL (ref 80.0–100.0)
MONO ABS: 0.6 10*3/uL (ref 0.1–1.0)
Monocytes Relative: 4 %
NEUTROS ABS: 12 10*3/uL — AB (ref 1.7–7.7)
Neutrophils Relative %: 84 %
PLATELETS: 308 10*3/uL (ref 150–400)
RBC: 3.76 MIL/uL — AB (ref 3.87–5.11)
RDW: 13.3 % (ref 11.5–15.5)
WBC: 14.2 10*3/uL — AB (ref 4.0–10.5)
nRBC: 0 % (ref 0.0–0.2)

## 2018-08-27 LAB — RAPID URINE DRUG SCREEN, HOSP PERFORMED
Amphetamines: POSITIVE — AB
BENZODIAZEPINES: POSITIVE — AB
Barbiturates: NOT DETECTED
Cocaine: NOT DETECTED
Opiates: POSITIVE — AB
Tetrahydrocannabinol: POSITIVE — AB

## 2018-08-27 LAB — URINALYSIS, ROUTINE W REFLEX MICROSCOPIC
Bilirubin Urine: NEGATIVE
Glucose, UA: 50 mg/dL — AB
Ketones, ur: 20 mg/dL — AB
Leukocytes, UA: NEGATIVE
Nitrite: POSITIVE — AB
Protein, ur: 30 mg/dL — AB
Specific Gravity, Urine: 1.019 (ref 1.005–1.030)
pH: 5 (ref 5.0–8.0)

## 2018-08-27 LAB — COMPREHENSIVE METABOLIC PANEL
ALT: 45 U/L — ABNORMAL HIGH (ref 0–44)
AST: 27 U/L (ref 15–41)
Albumin: 4.3 g/dL (ref 3.5–5.0)
Alkaline Phosphatase: 51 U/L (ref 38–126)
Anion gap: 11 (ref 5–15)
BUN: 23 mg/dL — AB (ref 6–20)
CO2: 24 mmol/L (ref 22–32)
Calcium: 8.9 mg/dL (ref 8.9–10.3)
Chloride: 103 mmol/L (ref 98–111)
Creatinine, Ser: 0.89 mg/dL (ref 0.44–1.00)
Glucose, Bld: 210 mg/dL — ABNORMAL HIGH (ref 70–99)
POTASSIUM: 3.3 mmol/L — AB (ref 3.5–5.1)
Sodium: 138 mmol/L (ref 135–145)
TOTAL PROTEIN: 7.8 g/dL (ref 6.5–8.1)
Total Bilirubin: 0.8 mg/dL (ref 0.3–1.2)

## 2018-08-27 LAB — ACETAMINOPHEN LEVEL: Acetaminophen (Tylenol), Serum: <10 ug/mL — ABNORMAL LOW (ref 10–30)

## 2018-08-27 LAB — I-STAT BETA HCG BLOOD, ED (MC, WL, AP ONLY): I-stat hCG, quantitative: <5 m[IU]/mL (ref <5)

## 2018-08-27 LAB — ETHANOL: Alcohol, Ethyl (B): <10 mg/dL (ref <10)

## 2018-08-27 MED ORDER — ALBUTEROL SULFATE (2.5 MG/3ML) 0.083% IN NEBU
5.0000 mg | INHALATION_SOLUTION | Freq: Once | RESPIRATORY_TRACT | Status: AC
  Administered 2018-08-27: 5 mg via RESPIRATORY_TRACT
  Filled 2018-08-27: qty 6

## 2018-08-27 MED ORDER — METHYLPREDNISOLONE SODIUM SUCC 125 MG IJ SOLR
125.0000 mg | Freq: Four times a day (QID) | INTRAMUSCULAR | Status: DC
  Administered 2018-08-27 – 2018-08-28 (×2): 125 mg via INTRAVENOUS
  Filled 2018-08-27 (×3): qty 2

## 2018-08-27 MED ORDER — ONDANSETRON HCL 4 MG/2ML IJ SOLN: 4.0000 mg | Freq: Once | INTRAMUSCULAR | Status: DC

## 2018-08-27 MED ORDER — ACETAMINOPHEN 500 MG PO TABS: 1000.0000 mg | ORAL_TABLET | Freq: Four times a day (QID) | ORAL | Status: DC | PRN

## 2018-08-27 MED ORDER — ENOXAPARIN SODIUM 40 MG/0.4ML ~~LOC~~ SOLN
40.0000 mg | SUBCUTANEOUS | Status: DC
  Administered 2018-08-28: 40 mg via SUBCUTANEOUS
  Filled 2018-08-27 (×2): qty 0.4

## 2018-08-27 MED ORDER — IBUPROFEN 200 MG PO TABS: 400.0000 mg | ORAL_TABLET | Freq: Four times a day (QID) | ORAL | Status: DC | PRN

## 2018-08-27 MED ORDER — AMPHETAMINE-DEXTROAMPHETAMINE 20 MG PO TABS
20.0000 mg | ORAL_TABLET | Freq: Every day | ORAL | Status: DC
  Administered 2018-08-28: 20 mg via ORAL
  Filled 2018-08-27: qty 1

## 2018-08-27 MED ORDER — SULFAMETHOXAZOLE-TRIMETHOPRIM 800-160 MG PO TABS
1.0000 | ORAL_TABLET | Freq: Two times a day (BID) | ORAL | Status: DC
  Administered 2018-08-27 – 2018-08-28 (×2): 1 via ORAL
  Filled 2018-08-27 (×2): qty 1

## 2018-08-27 MED ORDER — SODIUM CHLORIDE 0.9 % IV SOLN
1.0000 g | Freq: Every day | INTRAVENOUS | Status: DC
  Administered 2018-08-27: 1 g via INTRAVENOUS
  Filled 2018-08-27: qty 1
  Filled 2018-08-27: qty 10

## 2018-08-27 MED ORDER — METHYLPREDNISOLONE SODIUM SUCC 125 MG IJ SOLR
125.0000 mg | Freq: Once | INTRAMUSCULAR | Status: AC
  Administered 2018-08-27: 125 mg via INTRAVENOUS
  Filled 2018-08-27: qty 2

## 2018-08-27 MED ORDER — AZITHROMYCIN 250 MG PO TABS
500.0000 mg | ORAL_TABLET | Freq: Every day | ORAL | Status: DC
  Administered 2018-08-28: 500 mg via ORAL
  Filled 2018-08-27: qty 2

## 2018-08-27 MED ORDER — SODIUM CHLORIDE 0.9 % IV SOLN
INTRAVENOUS | Status: DC
  Administered 2018-08-27 – 2018-08-28 (×2): via INTRAVENOUS

## 2018-08-27 MED ORDER — IPRATROPIUM-ALBUTEROL 0.5-2.5 (3) MG/3ML IN SOLN
3.0000 mL | Freq: Four times a day (QID) | RESPIRATORY_TRACT | Status: DC
  Administered 2018-08-28 (×2): 3 mL via RESPIRATORY_TRACT
  Filled 2018-08-27 (×2): qty 3

## 2018-08-27 MED ORDER — SODIUM CHLORIDE 0.9 % IV BOLUS
1000.0000 mL | Freq: Once | INTRAVENOUS | Status: AC
  Administered 2018-08-27: 1000 mL via INTRAVENOUS

## 2018-08-27 MED ORDER — ONDANSETRON HCL 4 MG PO TABS: 4.0000 mg | ORAL_TABLET | Freq: Four times a day (QID) | ORAL | Status: DC | PRN

## 2018-08-27 MED ORDER — NICOTINE 21 MG/24HR TD PT24
21.0000 mg | MEDICATED_PATCH | Freq: Every day | TRANSDERMAL | Status: DC
  Administered 2018-08-28: 21 mg via TRANSDERMAL
  Filled 2018-08-27: qty 1

## 2018-08-27 MED ORDER — POTASSIUM CHLORIDE CRYS ER 20 MEQ PO TBCR
40.0000 meq | EXTENDED_RELEASE_TABLET | Freq: Once | ORAL | Status: AC
  Administered 2018-08-27: 40 meq via ORAL
  Filled 2018-08-27: qty 2

## 2018-08-27 MED ORDER — ONDANSETRON HCL 4 MG/2ML IJ SOLN: 4.0000 mg | Freq: Four times a day (QID) | INTRAMUSCULAR | Status: DC | PRN

## 2018-08-27 MED ORDER — ALPRAZOLAM 1 MG PO TABS
1.0000 mg | ORAL_TABLET | Freq: Three times a day (TID) | ORAL | Status: DC | PRN
  Administered 2018-08-28 (×2): 1 mg via ORAL
  Filled 2018-08-27 (×2): qty 1

## 2018-08-27 NOTE — ED Provider Notes (Signed)
[facility]-EMERGENCY DEPT Provider Note   CSN: [id number] Arrival date & time: 08/27/18  1558     History   Chief Complaint No chief complaint on file.   HPI Lavana Amend is a 43 y.o. female.  The history is provided by the patient and medical records. No language interpreter was used.   Aimie Masin is a 43 y.o. female who presents to the Emergency Department complaining of overdose.  Level V caveat due to confusion. History is provided by EMS. She presents for evaluation following an overdose. Per report she used heroin as well is a pain pill. Patient is unsure what she took but thinks she snorted Dilaudid. She received 3 mg of intranasal Narcan. Currently she reports chest tightness, nausea, vomiting, malaise. She denies any SI, HI. Past Medical History:  Diagnosis Date  . Alcohol abuse   . Anxiety   . Depression   . Ovarian cyst     Patient Active Problem List   Diagnosis Date Noted  . Acute respiratory failure with hypoxemia (HCC) 08/27/2018  . Accidental overdose 08/27/2018  . Moderate recurrent major depression (HCC) 02/25/2016  . Involuntary commitment 02/25/2016  . Cocaine abuse (HCC) 02/25/2016  . Substance induced mood disorder (HCC) 02/25/2016  . PTSD (post-traumatic stress disorder) 09/09/2012  . Benzodiazepine abuse (HCC) 09/09/2012  . Marijuana abuse 09/09/2012    Past Surgical History:  Procedure Laterality Date  . CESAREAN SECTION    . TOTAL KNEE ARTHROPLASTY       OB History    Gravida  4   Para  3   Term  1   Preterm  2   AB      Living  4     SAB      TAB      Ectopic      Multiple  1   Live Births  4            Home Medications    Prior to Admission medications   Medication Sig Start Date End Date Taking? Authorizing Provider  amphetamine-dextroamphetamine (ADDERALL) 20 MG tablet Take 20 mg by mouth 2 (two) times daily.    Yes [provider]  DULoxetine  (CYMBALTA) 60 MG capsule Take 60 mg by mouth daily.   Yes [provider]  acetaminophen (TYLENOL) 500 MG tablet Take 1,000 mg by mouth every 6 (six) hours as needed. For pain    [provider]  ALPRAZolam Prudy Feeler) 1 MG tablet Take 1 mg by mouth every 6 (six) hours as needed for anxiety.    [provider]  amoxicillin-clavulanate (AUGMENTIN) 875-125 MG tablet Take 1 tablet by mouth every 12 (twelve) hours. Patient not taking: Reported on 08/27/2018 04/10/17   Judeth Horn, NP  ibuprofen (ADVIL,MOTRIN) 200 MG tablet Take 400 mg by mouth every 6 (six) hours as needed. For pain    [provider]  sulfamethoxazole-trimethoprim (BACTRIM DS,SEPTRA DS) 800-160 MG tablet Take 1 tablet by mouth 2 (two) times daily. 04/10/17   Judeth Horn, NP    Family History Family History  Problem Relation Age of Onset  . Depression Mother   . Anxiety disorder Father   . Depression Father   . Alcohol abuse Father   . Drug abuse Father   . Depression Sister     Social History Social History   Tobacco Use  . Smoking status: Current Every Day Smoker    Packs/day: 0.50    Types: Cigarettes  .  Smokeless tobacco: Never Used  Substance Use Topics  . Alcohol use: No    Alcohol/week: 1.0 - 2.0 standard drinks    Types: 1 - 2 Cans of beer per week  . Drug use: No    Comment: Denies any drug use     Allergies   Patient has no known allergies.   Review of Systems Review of Systems  All other systems reviewed and are negative.    Physical Exam Updated Vital Signs BP 132/88 (BP Location: Right Arm)   Pulse 94   Temp 98 F (36.7 C) (Oral)   Resp 20   Ht 5\' 5"  (1.651 m)   Wt 109.9 kg   LMP 08/09/2018 (Approximate)   SpO2 95%   BMI 40.32 kg/m   Physical Exam Vitals signs and nursing note reviewed.  Constitutional:      Appearance: She is well-developed.     Comments: Disheveled, covered in vomitus  HENT:     Head: Normocephalic and atraumatic.    Cardiovascular:     Rate and Rhythm: Normal rate and regular rhythm.     Heart sounds: No murmur.  Pulmonary:     Effort: Pulmonary effort is normal. No respiratory distress.     Comments: Wheezes bilaterally Abdominal:     Palpations: Abdomen is soft.     Tenderness: There is no abdominal tenderness. There is no guarding or rebound.  Musculoskeletal:        General: No tenderness.  Skin:    General: Skin is warm and dry.     Capillary Refill: Capillary refill takes less than 2 seconds.  Neurological:     Mental Status: She is alert.     Comments: Mildly confused. Appears intoxicated. Five out of five strength in all four extremities. Disoriented to place.  Psychiatric:        Behavior: Behavior normal.      ED Treatments / Results  Labs (all labs ordered are listed, but only abnormal results are displayed) Labs Reviewed  COMPREHENSIVE METABOLIC PANEL - Abnormal; Notable for the following components:      Result Value   Potassium 3.3 (*)    Glucose, Bld 210 (*)    BUN 23 (*)    ALT 45 (*)    All other components within normal limits  CBC WITH DIFFERENTIAL/PLATELET - Abnormal; Notable for the following components:   WBC 14.2 (*)    RBC 3.76 (*)    Hemoglobin 11.2 (*)    HCT 35.7 (*)    Neutro Abs 12.0 (*)    Abs Immature Granulocytes 0.11 (*)    All other components within normal limits  ACETAMINOPHEN LEVEL - Abnormal; Notable for the following components:   Acetaminophen (Tylenol), Serum <10 (*)    All other components within normal limits  ETHANOL  RAPID URINE DRUG SCREEN, HOSP PERFORMED  URINALYSIS, ROUTINE W REFLEX MICROSCOPIC  HIV ANTIBODY (ROUTINE TESTING W REFLEX)  COMPREHENSIVE METABOLIC PANEL  CBC  CBG MONITORING, ED  I-STAT BETA HCG BLOOD, ED ([facility], [facility], [facility] ONLY)    EKG EKG Interpretation  Date/Time:  Saturday August 27 2018 16:18:46 EST Ventricular Rate:  96 PR Interval:    QRS Duration: 85 QT Interval:  427 QTC Calculation: 540 R  Axis:   51 Text Interpretation:  Sinus rhythm Borderline repolarization abnormality Prolonged QT interval Confirmed by Tilden Fossaees, Lemarcus Baggerly [phone]([id number]) on 08/27/2018 4:25:53 PM   Radiology Dg Chest Port 1 View  Result Date: 08/27/2018 CLINICAL DATA:  Shortness of breath.  EXAM: PORTABLE CHEST 1 VIEW COMPARISON:  Chest radiograph 07/01/2018 FINDINGS: Monitoring leads overlie the patient. Stable cardiac and mediastinal contours. No consolidative pulmonary opacities. No pleural effusion or pneumothorax. IMPRESSION: No acute cardiopulmonary process. Electronically Signed   By: Annia Beltrew  Davis M.D.   On: 08/27/2018 17:13    Procedures Procedures (including critical care time) CRITICAL CARE Performed by: Tilden FossaElizabeth Tonea Leiphart   Total critical care time: 35 minutes  Critical care time was exclusive of separately billable procedures and treating other patients.  Critical care was necessary to treat or prevent imminent or life-threatening deterioration.  Critical care was time spent personally by me on the following activities: development of treatment plan with patient and/or surrogate as well as nursing, discussions with consultants, evaluation of patient's response to treatment, examination of patient, obtaining history from patient or surrogate, ordering and performing treatments and interventions, ordering and review of laboratory studies, ordering and review of radiographic studies, pulse oximetry and re-evaluation of patient's condition.  Medications Ordered in ED Medications  enoxaparin (LOVENOX) injection 40 mg (has no administration in time range)  0.9 %  sodium chloride infusion ( Intravenous New Bag/Given 08/27/18 2034)  ondansetron (ZOFRAN) tablet 4 mg (has no administration in time range)    Or  ondansetron (ZOFRAN) injection 4 mg (has no administration in time range)  acetaminophen (TYLENOL) tablet 1,000 mg (has no administration in time range)  ibuprofen (ADVIL,MOTRIN) tablet 400 mg (has no  administration in time range)  ALPRAZolam (XANAX) tablet 1 mg (has no administration in time range)  amphetamine-dextroamphetamine (ADDERALL) tablet 20 mg (has no administration in time range)  sulfamethoxazole-trimethoprim (BACTRIM DS,SEPTRA DS) 800-160 MG per tablet 1 tablet (has no administration in time range)  albuterol (PROVENTIL) (2.5 MG/3ML) 0.083% nebulizer solution 5 mg (5 mg Nebulization Given 08/27/18 1649)  sodium chloride 0.9 % bolus 1,000 mL (0 mLs Intravenous Stopped 08/27/18 1839)  methylPREDNISolone sodium succinate (SOLU-MEDROL) 125 mg/2 mL injection 125 mg (125 mg Intravenous Given 08/27/18 1813)     Initial Impression / Assessment and Plan / ED Course  I have reviewed the triage vital signs and the nursing notes.  Pertinent labs & imaging results that were available during my care of the patient were reviewed by me and considered in my medical decision making (see chart for details).     Patient presents to the emergency department by EMS following accidental overdose on heroin and Dilaudid. Patient altered on ED arrival, intensive and hypoxic. She was treated with IV fluid resuscitation, albuterol and supplemental oxygen. On repeat assessment she does appear improved. She does have some persistent shortness of breath and oxygen requirement. She was treated with steroids for possible reactive airway/COPD given her history of tobacco use. Plan to admit for observation for hypoxia.  Final Clinical Impressions(s) / ED Diagnoses   Final diagnoses:  None    ED Discharge Orders    None       Tilden Fossaees, Georgeana Oertel, MD 08/27/18 2055

## 2018-08-27 NOTE — ED Notes (Signed)
Here she has denied heroin use. She tells [place] "It was just a pain pill". She is breathing much better.

## 2018-08-27 NOTE — ED Triage Notes (Signed)
She was found by her family to be obtunded. She admits to "snorting heroin and a pain pill" today. Her niece phoned EMS, who found her apneic. Brief rescue breathing was performed and 3mg  of Narcan was given I.N. she arrives awake, alert and nauseated. She is oriented x 4.

## 2018-08-27 NOTE — H&P (Signed)
History and Physical   Penny Mata WAQ:[id number] DOB: 11/29/1975 DOA: 08/27/2018  Referring MD/NP/PA: Dr. Madilyn Hook  PCP: Patient, No Pcp Per   Patient coming from: Home  Chief Complaint: Overdose  HPI: Penny Mata is a 43 y.o. female with medical history significant of anxiety disorder, depression, tobacco abuse, recent dental procedure who came to the ER complaining of overdose.  Patient reported the use of some pain fail and may be heroine to control dental pain.  She snorted what she thought was Dilaudid.  She was not trying to hurt herself.  Patient was seen by EMS and given 3 mg of intranasal Narcan.  She has since recovered and fully awake and alert.  She did have some chest tightness with some nausea but no vomiting denied any homicidal ideation or suicidal ideation.  Patient was seen in the ER and found to be wheezing.  She also has oxygen sats in the 80s.  Not on home oxygen.  She has never been diagnosed with COPD although she is a heavy smoker.  Patient is being admitted mainly because of the hypoxia and reevaluation for possible home O2 if not improved..  ED Course: Temperature 98 initial blood pressure 75/63 currently 133/65.  Pulse 96, respiratory rate of 29 and oxygen sat 83% on room air.  White count is 14.2 with hemoglobin 11.2 platelets 308.  Sodium 138 potassium 3.3 chloride 103 CO2 24 with BUN 23.  Creatinine 0.89 glucose 210.  Chest x-ray showed no active disease.  Urine drug screen is positive for amphetamines benzodiazepines opiates and tetrahydrocannabinol's.  Urinalysis is positive for nitrite many bacteria WBC 11-20.  Also moderate leukocyte.  Patient is being admitted with acute hypoxemia and UTI.  Also polysubstance abuse as well as inadvertent overdose.  Review of Systems: As per HPI otherwise 10 point review of systems negative.    Past Medical History:  Diagnosis Date  . Alcohol abuse   . Anxiety   . Depression   . Ovarian cyst     Past  Surgical History:  Procedure Laterality Date  . CESAREAN SECTION    . TOTAL KNEE ARTHROPLASTY       reports that she has been smoking cigarettes. She has been smoking about 0.50 packs per day. She has never used smokeless tobacco. She reports that she does not drink alcohol or use drugs.  No Known Allergies  Family History  Problem Relation Age of Onset  . Depression Mother   . Anxiety disorder Father   . Depression Father   . Alcohol abuse Father   . Drug abuse Father   . Depression Sister      Prior to Admission medications   Medication Sig Start Date End Date Taking? Authorizing Provider  acetaminophen (TYLENOL) 500 MG tablet Take 1,000 mg by mouth every 6 (six) hours as needed. For pain    [provider]  ALPRAZolam (XANAX) 0.5 MG tablet Take 1 mg by mouth 3 (three) times daily as needed for anxiety.     [provider]  amoxicillin-clavulanate (AUGMENTIN) 875-125 MG tablet Take 1 tablet by mouth every 12 (twelve) hours. 04/10/17   Judeth Horn, NP  amphetamine-dextroamphetamine (ADDERALL) 20 MG tablet Take 20 mg by mouth daily.    [provider]  ibuprofen (ADVIL,MOTRIN) 200 MG tablet Take 400 mg by mouth every 6 (six) hours as needed. For pain    [provider]  sulfamethoxazole-trimethoprim (BACTRIM DS,SEPTRA DS) 800-160 MG tablet Take 1 tablet by mouth 2 (  two) times daily. 04/10/17   Judeth HornLawrence, Erin, NP    Physical Exam: Vitals:   08/27/18 1752 08/27/18 1754 08/27/18 1830 08/27/18 1916  BP: 107/67 107/67 133/65 132/88  Pulse: 95 88 90 94  Resp: 17 17 (!) 25 20  Temp:    98 F (36.7 C)  TempSrc:    Oral  SpO2: 97% 98% 98% 95%  Weight:    109.9 kg  Height:    5\' 5"  (1.651 m)      Constitutional: NAD,  comfortable, irritable and anxious Vitals:   08/27/18 1752 08/27/18 1754 08/27/18 1830 08/27/18 1916  BP: 107/67 107/67 133/65 132/88  Pulse: 95 88 90 94  Resp: 17 17 (!) 25 20  Temp:    98 F (36.7 C)  TempSrc:    Oral    SpO2: 97% 98% 98% 95%  Weight:    109.9 kg  Height:    5\' 5"  (1.651 m)   Eyes: PERRL, lids and conjunctivae normal ENMT: Mucous membranes are moist. Posterior pharynx clear of any exudate or lesions.Normal dentition.  Neck: normal, supple, no masses, no thyromegaly Respiratory: Good air entry bilaterally but marked expiratory wheezing with rhonchi Cardiovascular: Regular rate and rhythm, no murmurs / rubs / gallops. No extremity edema. 2+ pedal pulses. No carotid bruits.  Abdomen: no tenderness, no masses palpated. No hepatosplenomegaly. Bowel sounds positive.  Musculoskeletal: no clubbing / cyanosis. No joint deformity upper and lower extremities. Good ROM, no contractures. Normal muscle tone.  Skin: no rashes, lesions, ulcers. No induration Neurologic: CN 2-12 grossly intact. Sensation intact, DTR normal. Strength 5/5 in all 4.  Psychiatric: Normal judgment and insight. Alert and oriented x 3. Normal mood.     Labs on Admission: I have personally reviewed following labs and imaging studies  CBC: Recent Labs  Lab 08/27/18 1615  WBC 14.2*  NEUTROABS 12.0*  HGB 11.2*  HCT 35.7*  MCV 94.9  PLT 308   Basic Metabolic Panel: Recent Labs  Lab 08/27/18 1615  NA 138  K 3.3*  CL 103  CO2 24  GLUCOSE 210*  BUN 23*  CREATININE 0.89  CALCIUM 8.9   GFR: Estimated Creatinine Clearance: 101.7 mL/min (by C-G formula based on SCr of 0.89 mg/dL). Liver Function Tests: Recent Labs  Lab 08/27/18 1615  AST 27  ALT 45*  ALKPHOS 51  BILITOT 0.8  PROT 7.8  ALBUMIN 4.3   No results for input(s): LIPASE, AMYLASE in the last 168 hours. No results for input(s): AMMONIA in the last 168 hours. Coagulation Profile: No results for input(s): INR, PROTIME in the last 168 hours. Cardiac Enzymes: No results for input(s): CKTOTAL, CKMB, CKMBINDEX, TROPONINI in the last 168 hours. BNP (last 3 results) No results for input(s): PROBNP in the last 8760 hours. HbA1C: No results for input(s):  HGBA1C in the last 72 hours. CBG: No results for input(s): GLUCAP in the last 168 hours. Lipid Profile: No results for input(s): CHOL, HDL, LDLCALC, TRIG, CHOLHDL, LDLDIRECT in the last 72 hours. Thyroid Function Tests: No results for input(s): TSH, T4TOTAL, FREET4, T3FREE, THYROIDAB in the last 72 hours. Anemia Panel: No results for input(s): VITAMINB12, FOLATE, FERRITIN, TIBC, IRON, RETICCTPCT in the last 72 hours. Urine analysis:    Component Value Date/Time   COLORURINE YELLOW 08/27/2018 2126   APPEARANCEUR HAZY (A) 08/27/2018 2126   LABSPEC 1.019 08/27/2018 2126   PHURINE 5.0 08/27/2018 2126   GLUCOSEU 50 (A) 08/27/2018 2126   HGBUR MODERATE (A) 08/27/2018 2126   BILIRUBINUR  NEGATIVE 08/27/2018 2126   KETONESUR 20 (A) 08/27/2018 2126   PROTEINUR 30 (A) 08/27/2018 2126   UROBILINOGEN 0.2 06/28/2013 1420   NITRITE POSITIVE (A) 08/27/2018 2126   LEUKOCYTESUR NEGATIVE 08/27/2018 2126   Sepsis Labs: @LABRCNTIP (procalcitonin:4,lacticidven:4) )No results found for this or any previous visit (from the past 240 hour(s)).   Radiological Exams on Admission: Dg Chest Port 1 View  Result Date: 08/27/2018 CLINICAL DATA:  Shortness of breath. EXAM: PORTABLE CHEST 1 VIEW COMPARISON:  Chest radiograph 07/01/2018 FINDINGS: Monitoring leads overlie the patient. Stable cardiac and mediastinal contours. No consolidative pulmonary opacities. No pleural effusion or pneumothorax. IMPRESSION: No acute cardiopulmonary process. Electronically Signed   By: Annia Beltrew  Davis M.D.   On: 08/27/2018 17:13    EKG: Independently reviewed.  Shows normal sinus rhythm with a rate of 96.  Prolonged QTC at 540.  No significant ST changes  Assessment/Plan Principal Problem:   Acute respiratory failure with hypoxemia (HCC) Active Problems:   PTSD (post-traumatic stress disorder)   Benzodiazepine abuse (HCC)   Marijuana abuse   Accidental overdose   Polysubstance abuse (HCC)   UTI (urinary tract infection)      #1 acute respiratory failure with hypoxemia: Patient is a heavy smoker so could have underlying COPD.  Patient will be admitted and started on steroids with nebulizer and antibiotics for possible COPD exacerbation.  Keep on oxygen and try titrating oxygen off.  If not possible patient may require home O2.  #2 polysubstance abuse: Patient will require more counseling.  Nicotine patch will be added.  She already responded to Narcan.  #3 inadvertent overdose: Patient denied any suicidal or homicidal ideation.  We will observe patient and continue counseling.  Resources for outpatient assistant with drug use will be offered.  #4 UTI: Patient appears to have symptoms of possible UTI.  Findings also suggest that.  Empirically start on Rocephin and get urine cultures.  #5 leukocytosis: Secondary to possible UTI.  Continue treatment.  #6 hypokalemia: Replete potassium.   DVT prophylaxis: Lovenox Code Status: Full code Family Communication: Significant other in the room Disposition Plan: Home Consults called: None Admission status: Inpatient  Severity of Illness: The appropriate patient status for this patient is INPATIENT. Inpatient status is judged to be reasonable and necessary in order to provide the required intensity of service to ensure the patient's safety. The patient's presenting symptoms, physical exam findings, and initial radiographic and laboratory data in the context of their chronic comorbidities is felt to place them at high risk for further clinical deterioration. Furthermore, it is not anticipated that the patient will be medically stable for discharge from the hospital within 2 midnights of admission. The following factors support the patient status of inpatient.   " The patient's presenting symptoms include shortness of breath and overdose. " The worrisome physical exam findings include marked expiratory wheezing. " The initial radiographic and laboratory data are worrisome  because of drug screen with multiple substances and oxygen sats in the 80s. " The chronic co-morbidities include depression with polysubstance use.   * I certify that at the point of admission it is my clinical judgment that the patient will require inpatient hospital care spanning beyond 2 midnights from the point of admission due to high intensity of service, high risk for further deterioration and high frequency of surveillance required.Lonia Blood*    Belem Hintze,LAWAL MD [facility] Pager [phone]  If 7PM-7AM, please contact night-coverage [email or web address] Password [email or web address]  08/27/2018, 10:45 PM

## 2018-08-28 ENCOUNTER — Other Ambulatory Visit: Payer: Self-pay

## 2018-08-28 ENCOUNTER — Encounter (HOSPITAL_COMMUNITY): Payer: Self-pay

## 2018-08-28 DIAGNOSIS — E876 Hypokalemia: Secondary | ICD-10-CM

## 2018-08-28 DIAGNOSIS — T50901A Poisoning by unspecified drugs, medicaments and biological substances, accidental (unintentional), initial encounter: Secondary | ICD-10-CM

## 2018-08-28 DIAGNOSIS — F1910 Other psychoactive substance abuse, uncomplicated: Secondary | ICD-10-CM

## 2018-08-28 DIAGNOSIS — N3000 Acute cystitis without hematuria: Secondary | ICD-10-CM

## 2018-08-28 DIAGNOSIS — F4310 Post-traumatic stress disorder, unspecified: Secondary | ICD-10-CM

## 2018-08-28 DIAGNOSIS — F1210 Cannabis abuse, uncomplicated: Secondary | ICD-10-CM

## 2018-08-28 LAB — COMPREHENSIVE METABOLIC PANEL
ALT: 37 U/L (ref 0–44)
AST: 24 U/L (ref 15–41)
Albumin: 3.9 g/dL (ref 3.5–5.0)
Alkaline Phosphatase: 47 U/L (ref 38–126)
Anion gap: 13 (ref 5–15)
BUN: 15 mg/dL (ref 6–20)
CO2: 19 mmol/L — ABNORMAL LOW (ref 22–32)
Calcium: 8.2 mg/dL — ABNORMAL LOW (ref 8.9–10.3)
Chloride: 103 mmol/L (ref 98–111)
Creatinine, Ser: 0.61 mg/dL (ref 0.44–1.00)
GFR calc Af Amer: >60 mL/min (ref >60)
GFR calc non Af Amer: >60 mL/min (ref >60)
GLUCOSE: 158 mg/dL — AB (ref 70–99)
Potassium: 3.9 mmol/L (ref 3.5–5.1)
Sodium: 135 mmol/L (ref 135–145)
TOTAL PROTEIN: 7.4 g/dL (ref 6.5–8.1)
Total Bilirubin: 0.3 mg/dL (ref 0.3–1.2)

## 2018-08-28 LAB — CBC
HCT: 33.3 % — ABNORMAL LOW (ref 36.0–46.0)
Hemoglobin: 10.6 g/dL — ABNORMAL LOW (ref 12.0–15.0)
MCH: 29.6 pg (ref 26.0–34.0)
MCHC: 31.8 g/dL (ref 30.0–36.0)
MCV: 93 fL (ref 80.0–100.0)
Platelets: 318 10*3/uL (ref 150–400)
RBC: 3.58 MIL/uL — ABNORMAL LOW (ref 3.87–5.11)
RDW: 13.2 % (ref 11.5–15.5)
WBC: 25 10*3/uL — ABNORMAL HIGH (ref 4.0–10.5)
nRBC: 0 % (ref 0.0–0.2)

## 2018-08-28 LAB — GLUCOSE, CAPILLARY: Glucose-Capillary: 190 mg/dL — ABNORMAL HIGH (ref 70–99)

## 2018-08-28 MED ORDER — OFLOXACIN 0.3 % OT SOLN: 5.0000 [drp] | Freq: Two times a day (BID) | OTIC | 0 refills | Status: AC

## 2018-08-28 MED ORDER — IPRATROPIUM-ALBUTEROL 0.5-2.5 (3) MG/3ML IN SOLN: 3.0000 mL | Freq: Three times a day (TID) | RESPIRATORY_TRACT | Status: DC

## 2018-08-28 MED ORDER — IPRATROPIUM-ALBUTEROL 0.5-2.5 (3) MG/3ML IN SOLN: 3.0000 mL | Freq: Four times a day (QID) | RESPIRATORY_TRACT | Status: DC

## 2018-08-28 MED ORDER — PNEUMOCOCCAL VAC POLYVALENT 25 MCG/0.5ML IJ INJ: 0.5000 mL | INJECTION | INTRAMUSCULAR | Status: DC

## 2018-08-28 MED ORDER — ALBUTEROL SULFATE (2.5 MG/3ML) 0.083% IN NEBU: 2.5000 mg | INHALATION_SOLUTION | RESPIRATORY_TRACT | Status: DC | PRN

## 2018-08-28 MED ORDER — INFLUENZA VAC SPLIT QUAD 0.5 ML IM SUSY: 0.5000 mL | PREFILLED_SYRINGE | INTRAMUSCULAR | Status: DC

## 2018-08-28 MED ORDER — CEPHALEXIN 500 MG PO CAPS: 500.0000 mg | ORAL_CAPSULE | Freq: Two times a day (BID) | ORAL | 0 refills | Status: AC

## 2018-08-28 MED ORDER — OFLOXACIN 0.3 % OT SOLN
5.0000 [drp] | Freq: Every day | OTIC | Status: DC
  Administered 2018-08-28: 5 [drp] via OTIC
  Filled 2018-08-28: qty 5

## 2018-08-28 NOTE — Discharge Summary (Signed)
Physician Discharge Summary  Penny Mata NOM:[id number] DOB: 1975-12-26 DOA: 08/27/2018  PCP: Patient, No Pcp Per  Admit date: 08/27/2018 Discharge date: 08/28/2018  Admitted From: Home Disposition: Home   Recommendations for Outpatient Follow-up:  1. Follow up with PCP in 1-2 weeks. Consider further work up if hypoxia returns. Smoking cessation counseling provided. 2. Follow up with psychiatry and psychotherapy as scheduled 3. Follow up with dental surgery 2/10  Home Health: None Equipment/Devices: None Discharge Condition: Stable CODE STATUS: Full Diet recommendation: As tolerated  Brief/Interim Summary: Penny Mata is a 42 y.o. female with a history of anxiety, depression, PTSD on adderall and xanax, tobacco use, and recent staged dental extractions who presented by EMS for drug ingestion, unintentional overdose. Due to dental pain following recent extractions with plans for further oral surgery on 2/10 she had been taking tylenol and ibuprofen which did not help the pain. Due to severity, she took a pill that her niece provided reported as percocet. After this she developed chest tightness, nausea, vomiting, and malaise, called EMS and was given narcan with improvement. She initially told the EDP she took heroin and snorted dilaudid but denies this on the following day. She was found to be hypoxic, given IV fluids, albuterol, and supplemental oxygen with improvement. She continued to report shortness of breath and appeared to have an oxygen requirement so she was observed overnight with steroids provided empirically given smoking history. UDS was positive for benzodiazepines, amphetamine, opiates and THC. The following day she is without complaint, has no dyspnea despite being off oxygen for hours. She has no SI, HI, says "I love my life, my 4 kids." She has had urinary urgency lately consistent with prior UTIs and has pain in the right ear and discharge.   Discharge  Diagnoses:  Principal Problem:   Acute respiratory failure with hypoxemia (HCC) Active Problems:   PTSD (post-traumatic stress disorder)   Benzodiazepine abuse (HCC)   Marijuana abuse   Accidental overdose   Polysubstance abuse (HCC)   UTI (urinary tract infection)   Hypokalemia  Unintentional overdose:  - Emphasized the need to not take any medications not prescribed to her  Dental caries:  - Follow up with oral surgeon as scheduled tomorrow - Ibuprofen prn only for pain control  Depression, ADHD, anxiety:  - Under the care of Dr. Guss Bunde longterm and talk therapist as well. Continue home medications.   Obesity: BMI 40, weight loss recommended  Otitis externa, uncomplicated, acute and recurrent:  - gtt's prescribed at discharge  UTI: Pyuria with urinary urgency/frequency.  - Rx empiric keflex, Cx pending  Discharge Instructions Discharge Instructions    Diet - low sodium heart healthy   Complete by:  As directed    Discharge instructions   Complete by:  As directed    Never take any drug/medication not prescribed to you.  Take ibuprofen and tylenol as needed for pain and follow up with oral surgery tomorrow Continue other medications as directed and follow up with psychiatry and therapist  Start taking keflex twice daily for 7 days to treat a UTI Start taking ofloxacin twice daily for 10 days to treat otitis externa (right ear infection)   Increase activity slowly   Complete by:  As directed      Allergies as of 08/28/2018   No Known Allergies     Medication List    STOP taking these medications   amoxicillin-clavulanate 875-125 MG tablet Commonly known as:  AUGMENTIN   sulfamethoxazole-trimethoprim 800-160  MG tablet Commonly known as:  BACTRIM DS,SEPTRA DS     TAKE these medications   acetaminophen 500 MG tablet Commonly known as:  TYLENOL Take 1,000 mg by mouth every 6 (six) hours as needed. For pain   ALPRAZolam 1 MG tablet Commonly known as:   XANAX Take 1 mg by mouth every 6 (six) hours as needed for anxiety.   amphetamine-dextroamphetamine 20 MG tablet Commonly known as:  ADDERALL Take 20 mg by mouth 2 (two) times daily.   cephALEXin 500 MG capsule Commonly known as:  KEFLEX Take 1 capsule (500 mg total) by mouth 2 (two) times daily.   DULoxetine 60 MG capsule Commonly known as:  CYMBALTA Take 60 mg by mouth daily.   ibuprofen 200 MG tablet Commonly known as:  ADVIL,MOTRIN Take 800 mg by mouth every 6 (six) hours as needed. For pain   ofloxacin 0.3 % OTIC solution Commonly known as:  FLOXIN Place 5 drops into the right ear 2 (two) times daily for 10 days.      Follow-up Information    Milagros EvenerKaur, Rupinder, MD Follow up.   Specialty:  Psychiatry Contact information: [address] [address] [place] [address] [phone]        Everardo AllFarless, Graham, DDS. Go on 08/29/2018.   Specialty:  Dentistry Contact information: [address] [place] [address] [phone]          No Known Allergies  Consultations:  None  Procedures/Studies: Dg Chest Port 1 View  Result Date: 08/27/2018 CLINICAL DATA:  Shortness of breath. EXAM: PORTABLE CHEST 1 VIEW COMPARISON:  Chest radiograph 07/01/2018 FINDINGS: Monitoring leads overlie the patient. Stable cardiac and mediastinal contours. No consolidative pulmonary opacities. No pleural effusion or pneumothorax. IMPRESSION: No acute cardiopulmonary process. Electronically Signed   By: Annia Beltrew  Davis M.D.   On: 08/27/2018 17:13    Subjective: No dyspnea despite being off oxygen for hours, wants to go home. She has no SI, HI, says "I love my life, my 4 kids." She has had urinary urgency lately consistent with prior UTIs and has pain in the right ear and discharge.   Discharge Exam: Vitals:   08/28/18 0531 08/28/18 0913  BP: 117/86   Pulse: 89   Resp: 20   Temp: 98.5 F (36.9 C)   SpO2: (!) 89% 93%   General: Pt is alert, awake, not in acute  distress HEENT: Tenderness with palpation of right pinna, changes in external canal consistent with otitis externa. Cardiovascular: RRR, S1/S2 +, no rubs, no gallops Respiratory: CTA bilaterally, no wheezing, no rhonchi Abdominal: Soft, NT, ND, bowel sounds + Extremities: No edema, no cyanosis  Labs: BNP (last 3 results) No results for input(s): BNP in the last 8760 hours. Basic Metabolic Panel: Recent Labs  Lab 08/27/18 1615 08/28/18 0527  NA 138 135  K 3.3* 3.9  CL 103 103  CO2 24 19*  GLUCOSE 210* 158*  BUN 23* 15  CREATININE 0.89 0.61  CALCIUM 8.9 8.2*   Liver Function Tests: Recent Labs  Lab 08/27/18 1615 08/28/18 0527  AST 27 24  ALT 45* 37  ALKPHOS 51 47  BILITOT 0.8 0.3  PROT 7.8 7.4  ALBUMIN 4.3 3.9   No results for input(s): LIPASE, AMYLASE in the last 168 hours. No results for input(s): AMMONIA in the last 168 hours. CBC: Recent Labs  Lab 08/27/18 1615 08/28/18 0527  WBC 14.2* 25.0*  NEUTROABS 12.0*  --   HGB 11.2* 10.6*  HCT  35.7* 33.3*  MCV 94.9 93.0  PLT 308 318   Cardiac Enzymes: No results for input(s): CKTOTAL, CKMB, CKMBINDEX, TROPONINI in the last 168 hours. BNP: Invalid input(s): POCBNP CBG: Recent Labs  Lab 08/27/18 1634  GLUCAP 190*   D-Dimer No results for input(s): DDIMER in the last 72 hours. Hgb A1c No results for input(s): HGBA1C in the last 72 hours. Lipid Profile No results for input(s): CHOL, HDL, LDLCALC, TRIG, CHOLHDL, LDLDIRECT in the last 72 hours. Thyroid function studies No results for input(s): TSH, T4TOTAL, T3FREE, THYROIDAB in the last 72 hours.  Invalid input(s): FREET3 Anemia work up No results for input(s): VITAMINB12, FOLATE, FERRITIN, TIBC, IRON, RETICCTPCT in the last 72 hours. Urinalysis    Component Value Date/Time   COLORURINE YELLOW 08/27/2018 2126   APPEARANCEUR HAZY (A) 08/27/2018 2126   LABSPEC 1.019 08/27/2018 2126   PHURINE 5.0 08/27/2018 2126   GLUCOSEU 50 (A) 08/27/2018 2126   HGBUR  MODERATE (A) 08/27/2018 2126   BILIRUBINUR NEGATIVE 08/27/2018 2126   KETONESUR 20 (A) 08/27/2018 2126   PROTEINUR 30 (A) 08/27/2018 2126   UROBILINOGEN 0.2 06/28/2013 1420   NITRITE POSITIVE (A) 08/27/2018 2126   LEUKOCYTESUR NEGATIVE 08/27/2018 2126    Microbiology No results found for this or any previous visit (from the past 240 hour(s)).  Time coordinating discharge: Approximately 40 minutes  Tyrone Nineyan B Neeti Knudtson, MD  [facility] 08/28/2018, 3:49 PM Pager [phone]

## 2018-08-28 NOTE — Progress Notes (Signed)
Patient discharge home alert and oriented x4. All instructions were given, patient verbalize understanding of all instructions. Skin intact no wound or pressure injurie noted. Denied any concern at this time. Patient was taken to lobby by wheelchair.

## 2018-08-28 NOTE — Plan of Care (Signed)
Pt was tearful off and on during shift as we discussed some of the issues she is having in her personal life. Pt currently under the care of a psychiatrist and receives meds from them.Pt states that this was not an attempt to harm herself and that she would never harm herself and abandon her children.

## 2018-08-29 LAB — HIV ANTIBODY (ROUTINE TESTING W REFLEX): HIV SCREEN 4TH GENERATION: NONREACTIVE

## 2018-09-20 ENCOUNTER — Ambulatory Visit: Payer: Self-pay | Admitting: Family Medicine

## 2019-01-31 ENCOUNTER — Encounter (HOSPITAL_COMMUNITY): Payer: Self-pay

## 2019-01-31 ENCOUNTER — Emergency Department (HOSPITAL_COMMUNITY)
Admission: EM | Admit: 2019-01-31 | Discharge: 2019-01-31 | Disposition: A | Discharge status: Home / Self Care | Payer: Self-pay | Attending: Emergency Medicine | Admitting: Emergency Medicine

## 2019-01-31 ENCOUNTER — Emergency Department (HOSPITAL_COMMUNITY): Payer: Self-pay

## 2019-01-31 ENCOUNTER — Other Ambulatory Visit: Payer: Self-pay

## 2019-01-31 DIAGNOSIS — W25XXXA Contact with sharp glass, initial encounter: Secondary | ICD-10-CM | POA: Insufficient documentation

## 2019-01-31 DIAGNOSIS — Y939 Activity, unspecified: Secondary | ICD-10-CM | POA: Insufficient documentation

## 2019-01-31 DIAGNOSIS — F17210 Nicotine dependence, cigarettes, uncomplicated: Secondary | ICD-10-CM | POA: Insufficient documentation

## 2019-01-31 DIAGNOSIS — Y999 Unspecified external cause status: Secondary | ICD-10-CM | POA: Insufficient documentation

## 2019-01-31 DIAGNOSIS — S41111A Laceration without foreign body of right upper arm, initial encounter: Secondary | ICD-10-CM | POA: Insufficient documentation

## 2019-01-31 DIAGNOSIS — Y929 Unspecified place or not applicable: Secondary | ICD-10-CM | POA: Insufficient documentation

## 2019-01-31 DIAGNOSIS — Z23 Encounter for immunization: Secondary | ICD-10-CM | POA: Insufficient documentation

## 2019-01-31 DIAGNOSIS — Z79899 Other long term (current) drug therapy: Secondary | ICD-10-CM | POA: Insufficient documentation

## 2019-01-31 MED ORDER — TETANUS-DIPHTH-ACELL PERTUSSIS 5-2.5-18.5 LF-MCG/0.5 IM SUSP
0.5000 mL | Freq: Once | INTRAMUSCULAR | Status: AC
  Administered 2019-01-31: 0.5 mL via INTRAMUSCULAR
  Filled 2019-01-31: qty 0.5

## 2019-01-31 MED ORDER — ACETAMINOPHEN 325 MG PO TABS
650.0000 mg | ORAL_TABLET | Freq: Once | ORAL | Status: AC
  Administered 2019-01-31: 650 mg via ORAL
  Filled 2019-01-31: qty 2

## 2019-01-31 MED ORDER — BACITRACIN ZINC 500 UNIT/GM EX OINT
TOPICAL_OINTMENT | Freq: Once | CUTANEOUS | Status: AC
  Administered 2019-01-31: 16:00:00 via TOPICAL
  Filled 2019-01-31: qty 0.9

## 2019-01-31 MED ORDER — LIDOCAINE-EPINEPHRINE (PF) 2 %-1:200000 IJ SOLN
10.0000 mL | Freq: Once | INTRAMUSCULAR | Status: AC
  Administered 2019-01-31: 10 mL
  Filled 2019-01-31: qty 10

## 2019-01-31 NOTE — Discharge Instructions (Signed)
Your stitches will need to be removed in 7 to 10 days.  Keep area clean and dry, covered with antibiotic ointment and bandage.  You may shower but do not submerge the arm underwater, no swimming.  Monitor the area closely for any signs of infection such as redness, swelling, warmth, increasing pain or drainage if these occur you should return for reevaluation.

## 2019-01-31 NOTE — ED Triage Notes (Signed)
Pt sustained a laceration when window pain fell on her. Pt has injury to rt forearm. Bleeding controlled.

## 2019-01-31 NOTE — ED Provider Notes (Signed)
[facility]-EMERGENCY DEPT Provider Note   CSN: [id number] Arrival date & time: 01/31/19  1402     History   Chief Complaint No chief complaint on file.   HPI Penny Mata is a 43 y.o. female.     Penny LeatherwoodCourtney Elizabeth Mensing is a 43 y.o. female with a history of anxiety, depression, polysubstance abuse, who who presents to the emergency department for evaluation of a laceration to the right forearm.  Patient reports a window fell towards her and when she tried to use her arm to break the windows fall the glass broke causing a 3 to 4 cm laceration to the right forearm.  Bleeding is controlled.  No numbness weakness or tingling.  Able to move all fingers without difficulty.  Reports mild pain in this area.  R  Reports that she thinks her last tetanus vaccine was over 10 years ago.  Nothing for pain prior to arrival.     Past Medical History:  Diagnosis Date  . Alcohol abuse   . Anxiety   . Depression   . Ovarian cyst     Patient Active Problem List   Diagnosis Date Noted  . Acute respiratory failure with hypoxemia (HCC) 08/27/2018  . Accidental overdose 08/27/2018  . Polysubstance abuse (HCC) 08/27/2018  . UTI (urinary tract infection) 08/27/2018  . Hypokalemia 08/27/2018  . Moderate recurrent major depression (HCC) 02/25/2016  . Involuntary commitment 02/25/2016  . Cocaine abuse (HCC) 02/25/2016  . Substance induced mood disorder (HCC) 02/25/2016  . PTSD (post-traumatic stress disorder) 09/09/2012  . Benzodiazepine abuse (HCC) 09/09/2012  . Marijuana abuse 09/09/2012    Past Surgical History:  Procedure Laterality Date  . CESAREAN SECTION    . TOTAL KNEE ARTHROPLASTY       OB History    Gravida  4   Para  3   Term  1   Preterm  2   AB      Living  4     SAB      TAB      Ectopic      Multiple  1   Live Births  4            Home Medications    Prior to Admission medications   Medication Sig Start Date  End Date Taking? Authorizing Provider  acetaminophen (TYLENOL) 500 MG tablet Take 1,000 mg by mouth every 6 (six) hours as needed. For pain    [provider]  ALPRAZolam Prudy Feeler(XANAX) 1 MG tablet Take 1 mg by mouth every 6 (six) hours as needed for anxiety.    [provider]  amphetamine-dextroamphetamine (ADDERALL) 20 MG tablet Take 20 mg by mouth 2 (two) times daily.     [provider]  cephALEXin (KEFLEX) 500 MG capsule Take 1 capsule (500 mg total) by mouth 2 (two) times daily. 08/28/18   Tyrone NineGrunz, Ryan B, MD  DULoxetine (CYMBALTA) 60 MG capsule Take 60 mg by mouth daily.    [provider]  ibuprofen (ADVIL,MOTRIN) 200 MG tablet Take 800 mg by mouth every 6 (six) hours as needed. For pain     [provider]    Family History Family History  Problem Relation Age of Onset  . Depression Mother   . Anxiety disorder Father   . Depression Father   . Alcohol abuse Father   . Drug abuse Father   . Depression Sister     Social History Social History   Tobacco Use  .  Smoking status: Current Every Day Smoker    Packs/day: 0.50    Types: Cigarettes  . Smokeless tobacco: Never Used  Substance Use Topics  . Alcohol use: No    Alcohol/week: 1.0 - 2.0 standard drinks    Types: 1 - 2 Cans of beer per week  . Drug use: No    Comment: Denies any drug use     Allergies   Patient has no known allergies.   Review of Systems Review of Systems  Constitutional: Negative for chills and fever.  Skin: Positive for wound.  Neurological: Negative for weakness and numbness.     Physical Exam Updated Vital Signs BP (!) 117/103 (BP Location: Left Arm)   Pulse 99   Temp 98.2 F (36.8 C) (Oral)   Resp 20   SpO2 99%   Physical Exam Vitals signs and nursing note reviewed.  Constitutional:      General: She is not in acute distress.    Appearance: Normal appearance. She is well-developed. She is obese. She is not diaphoretic.  HENT:     Head:  Normocephalic and atraumatic.  Eyes:     General:        Right eye: No discharge.        Left eye: No discharge.  Pulmonary:     Effort: Pulmonary effort is normal. No respiratory distress.  Musculoskeletal:     Comments: 3.5 cm laceration to the ulnar aspect of the right forearm, bleeding controlled, normal sensation and strength distal to the wound, 2+ distal pulse and good capillary refill.  Skin:    General: Skin is warm and dry.     Capillary Refill: Capillary refill takes less than 2 seconds.  Neurological:     Mental Status: She is alert.     Coordination: Coordination normal.  Psychiatric:        Mood and Affect: Mood normal.        Behavior: Behavior normal.      ED Treatments / Results  Labs (all labs ordered are listed, but only abnormal results are displayed) Labs Reviewed - No data to display  EKG None  Radiology Dg Forearm Right  Result Date: 01/31/2019 CLINICAL DATA:  Laceration following breaking window, possible foreign body EXAM: RIGHT FOREARM - 2 VIEW COMPARISON:  None. FINDINGS: Laceration is identified medially. No bony abnormality is seen. No radiopaque foreign body is noted. IMPRESSION: Soft tissue injury without acute bony abnormality. Electronically Signed   By: Inez Catalina M.D.   On: 01/31/2019 15:16    Procedures .Marland KitchenLaceration Repair  Date/Time: 01/31/2019 3:57 PM Performed by: Jacqlyn Larsen, PA-C Authorized by: Jacqlyn Larsen, PA-C   Consent:    Consent obtained:  Verbal   Consent given by:  Patient   Risks discussed:  Infection, pain, poor cosmetic result, need for additional repair and poor wound healing   Alternatives discussed:  No treatment Anesthesia (see MAR for exact dosages):    Anesthesia method:  Local infiltration   Local anesthetic:  Lidocaine 2% WITH epi Laceration details:    Location:  Shoulder/arm   Shoulder/arm location:  R lower arm   Length (cm):  3.5   Depth (mm):  5 Repair type:    Repair type:  Simple  Pre-procedure details:    Preparation:  Patient was prepped and draped in usual sterile fashion Exploration:    Hemostasis achieved with:  Epinephrine and direct pressure   Wound exploration: entire depth of wound probed and visualized  Wound extent: areolar tissue violated   Treatment:    Area cleansed with:  Saline   Amount of cleaning:  Standard   Irrigation solution:  Sterile saline   Irrigation method:  Syringe Skin repair:    Repair method:  Sutures   Suture size:  4-0   Suture material:  Prolene   Suture technique:  Simple interrupted   Number of sutures:  3 Approximation:    Approximation:  Close Post-procedure details:    Dressing:  Antibiotic ointment and adhesive bandage   Patient tolerance of procedure:  Tolerated well, no immediate complications Comments:     Wound sutured by PA student with my supervision   (including critical care time)  Medications Ordered in ED Medications - No data to display   Initial Impression / Assessment and Plan / ED Course  I have reviewed the triage vital signs and the nursing notes.  Pertinent labs & imaging results that were available during my care of the patient were reviewed by me and considered in my medical decision making (see chart for details).  Patient presents with laceration to the right forearm that occurred prior to arrival after a piece of glass fell towards her arm breaking.  No obvious evidence of foreign bodies and x-ray shows no evidence of glass within the wound.  The right hand is neurovascularly intact.  The wound was cleaned and sutured by PA student with my supervision with good cosmesis.  Discussed appropriate wound care and return precautions.  Suture removal in 7 to 10 days.  Patient expresses understanding and agreement with plan.  Discharged home in good condition.  Final Clinical Impressions(s) / ED Diagnoses   Final diagnoses:  Laceration of right upper extremity, initial encounter    ED  Discharge Orders    None       Dartha LodgeFord, Maxene Byington N, [place] JerseyPA-C 01/31/19 1616    Tegeler, Canary Brimhristopher J, MD 01/31/19 1740

## 2019-05-07 ENCOUNTER — Other Ambulatory Visit: Payer: Self-pay

## 2019-05-07 ENCOUNTER — Inpatient Hospital Stay (HOSPITAL_COMMUNITY)
Admission: AD | Admit: 2019-05-07 | Discharge: 2019-05-07 | Disposition: A | Discharge status: Home / Self Care | Payer: Self-pay | Attending: Obstetrics & Gynecology | Admitting: Obstetrics & Gynecology

## 2019-05-07 DIAGNOSIS — Z3202 Encounter for pregnancy test, result negative: Secondary | ICD-10-CM | POA: Insufficient documentation

## 2019-05-07 DIAGNOSIS — N939 Abnormal uterine and vaginal bleeding, unspecified: Secondary | ICD-10-CM | POA: Insufficient documentation

## 2019-05-07 LAB — POCT PREGNANCY, URINE: Preg Test, Ur: NEGATIVE

## 2019-05-07 NOTE — MAU Note (Addendum)
Pt from ED. Had a +upt at home about 1 month ago. States she was pregnant, but lost the baby. Reports heavy vaginal bleeding and clots and "contractions" that started 7 days ago. Has tried Tylenol and Ibuprofen but hasn't helped. LMP: unsure but hasn't had a period in 6-8 months. Thought she was perimenopausal.

## 2019-05-07 NOTE — MAU Note (Signed)
Pt not in lobby.  

## 2019-05-07 NOTE — MAU Provider Note (Signed)
First Provider Initiated Contact with Patient 05/07/19 2027      S [name] is a 43 y.o. [phone] non-pregnant female who presents to MAU today with complaint of vaginal bleeding. Pt states she had a positive pregnancy test at home about 4 weeks ago. UPT today in MAU negative. Pt was transferred from ED.  O BP 131/64 (BP Location: Right Arm)   Pulse 100   Temp 98.3 F (36.8 C) (Oral)   Resp 20   Ht 5\' 4"  (1.626 m)   Wt 111.6 kg   SpO2 98%   BMI 42.23 kg/m   Patient Vitals for the past 24 hrs:  BP Temp Temp src Pulse Resp SpO2 Height Weight  05/07/19 2014 131/64 98.3 F (36.8 C) Oral 100 20 98 % 5\' 4"  (1.626 m) 111.6 kg   Physical Exam  Constitutional: She is oriented to person, place, and time. She appears well-developed and well-nourished. No distress.  HENT:  Head: Normocephalic and atraumatic.  Respiratory: Effort normal.  Neurological: She is alert and oriented to person, place, and time.  Skin: She is not diaphoretic.  Psychiatric: She has a normal mood and affect. Her behavior is normal. Judgment and thought content normal.   A Non pregnant female Medical screening exam complete  Pt expressed desire to leave MAU, stating she will follow-up with [facility] for on-going care because it is "more affordable." Explained to patient that this is an emergency department setting and she cannot be turned away for insurance status or ability to pay. Advised patient that she should stay and be evaluated and treated as necessary. Pt states she will discuss with her husband and consider her options. Pt advised of MAU process to confirm pregnancy after a positive pregnancy test at home, and advised that even though our urine pregnancy test is negative, we would next perform a blood draw on her to check her hCG level. Pt also inquired about "where the [facility] hospital is." Explained to patient that this is the new women's hospital, but we only see  OB-related concerns at this time and do not see GYN concerns since moving from our former site in late February 2020. Pt verbalizes understanding. Also discussed with patient that, per Care Everywhere, her blood type is A NEGATIVE, and if her hCG were to come back elevated on a blood draw, she would be a candidate for RhoGAM. Pt verbalizes understanding.  P Pt left without having blood drawn for hCG and without advising staff that she was leaving, despite that patient was informed to let the staff know if she decided to leave after discussing with her husband. Warning signs for worsening condition that would warrant emergency follow-up discussed Patient may return to MAU as needed for pregnancy related complaints  Airrion Otting, Gerrie Nordmann, NP 05/07/2019 8:57 PM

## 2019-05-07 NOTE — MAU Note (Signed)
Not in lobby

## 2020-04-05 ENCOUNTER — Emergency Department (HOSPITAL_COMMUNITY)
Admission: EM | Admit: 2020-04-05 | Discharge: 2020-04-08 | Disposition: A | Discharge status: Home / Self Care | Payer: Self-pay | Attending: Emergency Medicine | Admitting: Emergency Medicine

## 2020-04-05 ENCOUNTER — Other Ambulatory Visit: Payer: Self-pay

## 2020-04-05 DIAGNOSIS — F4310 Post-traumatic stress disorder, unspecified: Secondary | ICD-10-CM | POA: Diagnosis present

## 2020-04-05 DIAGNOSIS — R443 Hallucinations, unspecified: Secondary | ICD-10-CM | POA: Insufficient documentation

## 2020-04-05 DIAGNOSIS — F17210 Nicotine dependence, cigarettes, uncomplicated: Secondary | ICD-10-CM | POA: Insufficient documentation

## 2020-04-05 DIAGNOSIS — F1520 Other stimulant dependence, uncomplicated: Secondary | ICD-10-CM | POA: Diagnosis present

## 2020-04-05 DIAGNOSIS — Z96659 Presence of unspecified artificial knee joint: Secondary | ICD-10-CM | POA: Insufficient documentation

## 2020-04-05 DIAGNOSIS — F15950 Other stimulant use, unspecified with stimulant-induced psychotic disorder with delusions: Secondary | ICD-10-CM

## 2020-04-05 DIAGNOSIS — F32A Depression, unspecified: Secondary | ICD-10-CM

## 2020-04-05 DIAGNOSIS — F322 Major depressive disorder, single episode, severe without psychotic features: Secondary | ICD-10-CM

## 2020-04-05 DIAGNOSIS — R410 Disorientation, unspecified: Secondary | ICD-10-CM | POA: Insufficient documentation

## 2020-04-05 DIAGNOSIS — F1310 Sedative, hypnotic or anxiolytic abuse, uncomplicated: Secondary | ICD-10-CM | POA: Diagnosis present

## 2020-04-05 DIAGNOSIS — Z20822 Contact with and (suspected) exposure to covid-19: Secondary | ICD-10-CM | POA: Insufficient documentation

## 2020-04-05 DIAGNOSIS — E876 Hypokalemia: Secondary | ICD-10-CM

## 2020-04-05 LAB — CBC WITH DIFFERENTIAL/PLATELET
Abs Immature Granulocytes: 0.06 10*3/uL (ref 0.00–0.07)
Basophils Absolute: 0 10*3/uL (ref 0.0–0.1)
Basophils Relative: 0 %
Eosinophils Absolute: 0 10*3/uL (ref 0.0–0.5)
Eosinophils Relative: 0 %
HCT: 31.5 % — ABNORMAL LOW (ref 36.0–46.0)
Hemoglobin: 10.8 g/dL — ABNORMAL LOW (ref 12.0–15.0)
Immature Granulocytes: 0 %
Lymphocytes Relative: 9 %
Lymphs Abs: 1.3 10*3/uL (ref 0.7–4.0)
MCH: 29.8 pg (ref 26.0–34.0)
MCHC: 34.3 g/dL (ref 30.0–36.0)
MCV: 87 fL (ref 80.0–100.0)
Monocytes Absolute: 1.2 10*3/uL — ABNORMAL HIGH (ref 0.1–1.0)
Monocytes Relative: 9 %
Neutro Abs: 10.8 10*3/uL — ABNORMAL HIGH (ref 1.7–7.7)
Neutrophils Relative %: 82 %
Platelets: 306 10*3/uL (ref 150–400)
RBC: 3.62 MIL/uL — ABNORMAL LOW (ref 3.87–5.11)
RDW: 13 % (ref 11.5–15.5)
WBC: 13.4 10*3/uL — ABNORMAL HIGH (ref 4.0–10.5)
nRBC: 0 % (ref 0.0–0.2)

## 2020-04-05 LAB — RAPID URINE DRUG SCREEN, HOSP PERFORMED
Amphetamines: POSITIVE — AB
Barbiturates: NOT DETECTED
Benzodiazepines: NOT DETECTED
Cocaine: NOT DETECTED
Opiates: NOT DETECTED
Tetrahydrocannabinol: POSITIVE — AB

## 2020-04-05 LAB — HCG, QUANTITATIVE, PREGNANCY: hCG, Beta Chain, Quant, S: 1 m[IU]/mL (ref <5)

## 2020-04-05 LAB — COMPREHENSIVE METABOLIC PANEL
ALT: 44 U/L (ref 0–44)
AST: 39 U/L (ref 15–41)
Albumin: 4.3 g/dL (ref 3.5–5.0)
Alkaline Phosphatase: 69 U/L (ref 38–126)
Anion gap: 10 (ref 5–15)
BUN: 12 mg/dL (ref 6–20)
CO2: 24 mmol/L (ref 22–32)
Calcium: 8.7 mg/dL — ABNORMAL LOW (ref 8.9–10.3)
Chloride: 97 mmol/L — ABNORMAL LOW (ref 98–111)
Creatinine, Ser: 0.76 mg/dL (ref 0.44–1.00)
GFR calc Af Amer: >60 mL/min (ref >60)
GFR calc non Af Amer: >60 mL/min (ref >60)
Glucose, Bld: 182 mg/dL — ABNORMAL HIGH (ref 70–99)
Potassium: 2.6 mmol/L — CL (ref 3.5–5.1)
Sodium: 131 mmol/L — ABNORMAL LOW (ref 135–145)
Total Bilirubin: 1 mg/dL (ref 0.3–1.2)
Total Protein: 7.8 g/dL (ref 6.5–8.1)

## 2020-04-05 LAB — ETHANOL: Alcohol, Ethyl (B): <10 mg/dL (ref <10)

## 2020-04-05 LAB — SALICYLATE LEVEL: Salicylate Lvl: <7 mg/dL — ABNORMAL LOW (ref 7.0–30.0)

## 2020-04-05 LAB — ACETAMINOPHEN LEVEL: Acetaminophen (Tylenol), Serum: <10 ug/mL — ABNORMAL LOW (ref 10–30)

## 2020-04-05 LAB — MAGNESIUM: Magnesium: 2 mg/dL (ref 1.7–2.4)

## 2020-04-05 MED ORDER — LORAZEPAM 1 MG PO TABS
1.0000 mg | ORAL_TABLET | ORAL | Status: DC | PRN
  Administered 2020-04-06 – 2020-04-07 (×7): 1 mg via ORAL
  Filled 2020-04-05 (×8): qty 1

## 2020-04-05 MED ORDER — POTASSIUM CHLORIDE CRYS ER 20 MEQ PO TBCR
40.0000 meq | EXTENDED_RELEASE_TABLET | Freq: Once | ORAL | Status: AC
  Administered 2020-04-06: 40 meq via ORAL
  Filled 2020-04-05: qty 2

## 2020-04-05 MED ORDER — LORAZEPAM 2 MG/ML IJ SOLN
2.0000 mg | Freq: Once | INTRAMUSCULAR | Status: AC
  Administered 2020-04-05: 2 mg via INTRAVENOUS
  Filled 2020-04-05: qty 1

## 2020-04-05 MED ORDER — SODIUM CHLORIDE 0.9 % IV BOLUS
1000.0000 mL | Freq: Once | INTRAVENOUS | Status: AC
  Administered 2020-04-05: 1000 mL via INTRAVENOUS

## 2020-04-05 MED ORDER — POTASSIUM CHLORIDE 10 MEQ/100ML IV SOLN
10.0000 meq | INTRAVENOUS | Status: AC
  Administered 2020-04-06 (×2): 10 meq via INTRAVENOUS
  Filled 2020-04-05 (×2): qty 100

## 2020-04-05 MED ORDER — HALOPERIDOL LACTATE 5 MG/ML IJ SOLN
2.0000 mg | Freq: Once | INTRAMUSCULAR | Status: AC
  Administered 2020-04-05: 2 mg via INTRAVENOUS
  Filled 2020-04-05: qty 1

## 2020-04-05 NOTE — ED Triage Notes (Signed)
Pt. Presented to the ED alert but orietantion is not clear. Pt. Is hallucinating and anxious. Pt. States she didn't take any medication. Hx of clinical depression, adhd, bipolar.  134/ 89 HR 114 O2- 99% RR 26

## 2020-04-05 NOTE — ED Provider Notes (Signed)
[facility]-EMERGENCY DEPT Provider Note   CSN: [id number] Arrival date & time: 04/05/20  1914     History Chief Complaint  Patient presents with  . Altered Mental Status    Penny Mata is a 44 y.o. female history depression and alcohol abuse who presented with hallucinations and possible overdose on Adderall.  Patient has not been sleeping well and has been agitated and very verbally abusive to her husband.  As a result, several days ago, patient told her husband to leave the house.  He left and he came back yesterday and pill bottles scattered throughout.  He suspected that she took too much of her Adderall.  However patient denies it.  Patient has been hallucinating and thinking that her husband slept with another woman.  She states that she saw a woman in her bed as well.   The history is provided by the patient and the spouse.       Past Medical History:  Diagnosis Date  . Alcohol abuse   . Anxiety   . Depression   . Ovarian cyst     Patient Active Problem List   Diagnosis Date Noted  . Acute respiratory failure with hypoxemia (HCC) 08/27/2018  . Accidental overdose 08/27/2018  . Polysubstance abuse (HCC) 08/27/2018  . UTI (urinary tract infection) 08/27/2018  . Hypokalemia 08/27/2018  . Moderate recurrent major depression (HCC) 02/25/2016  . Involuntary commitment 02/25/2016  . Cocaine abuse (HCC) 02/25/2016  . Substance induced mood disorder (HCC) 02/25/2016  . PTSD (post-traumatic stress disorder) 09/09/2012  . Benzodiazepine abuse (HCC) 09/09/2012  . Marijuana abuse 09/09/2012    Past Surgical History:  Procedure Laterality Date  . CESAREAN SECTION    . TOTAL KNEE ARTHROPLASTY       OB History    Gravida  4   Para  3   Term  1   Preterm  2   AB      Living  4     SAB      TAB      Ectopic      Multiple  1   Live Births  4           Family History  Problem Relation Age of Onset  . Depression  Mother   . Anxiety disorder Father   . Depression Father   . Alcohol abuse Father   . Drug abuse Father   . Depression Sister     Social History   Tobacco Use  . Smoking status: Current Every Day Smoker    Packs/day: 0.50    Types: Cigarettes  . Smokeless tobacco: Never Used  Substance Use Topics  . Alcohol use: No    Alcohol/week: 1.0 - 2.0 standard drink    Types: 1 - 2 Cans of beer per week  . Drug use: No    Comment: Denies any drug use    Home Medications Prior to Admission medications   Medication Sig Start Date End Date Taking? Authorizing Provider  acetaminophen (TYLENOL) 500 MG tablet Take 1,000 mg by mouth every 6 (six) hours as needed. For pain    [provider]  ALPRAZolam Prudy Feeler) 1 MG tablet Take 1 mg by mouth every 6 (six) hours as needed for anxiety.    [provider]  amphetamine-dextroamphetamine (ADDERALL) 20 MG tablet Take 20 mg by mouth 2 (two) times daily.     [provider]  cephALEXin (KEFLEX) 500 MG capsule Take 1 capsule (500  mg total) by mouth 2 (two) times daily. 08/28/18   Tyrone Nine, MD  DULoxetine (CYMBALTA) 60 MG capsule Take 60 mg by mouth daily.    [provider]  ibuprofen (ADVIL,MOTRIN) 200 MG tablet Take 800 mg by mouth every 6 (six) hours as needed. For pain     [provider]    Allergies    Patient has no known allergies.  Review of Systems   Review of Systems  Psychiatric/Behavioral: Positive for dysphoric mood and hallucinations.  All other systems reviewed and are negative.   Physical Exam Updated Vital Signs BP (!) 129/101 (BP Location: Right Arm)   Pulse (!) 132   Temp 98.4 F (36.9 C) (Oral)   Resp (!) 30   Ht 5\' 5"  (1.651 m)   SpO2 100%   BMI 40.94 kg/m   Physical Exam Vitals and nursing note reviewed.  Constitutional:      Comments: Confused, tearful  HENT:     Head: Normocephalic.     Mouth/Throat:     Mouth: Mucous membranes are moist.  Eyes:      Extraocular Movements: Extraocular movements intact.     Pupils: Pupils are equal, round, and reactive to light.  Cardiovascular:     Rate and Rhythm: Normal rate and regular rhythm.     Pulses: Normal pulses.     Heart sounds: Normal heart sounds.  Pulmonary:     Effort: Pulmonary effort is normal.     Breath sounds: Normal breath sounds.  Abdominal:     General: Abdomen is flat.     Palpations: Abdomen is soft.  Musculoskeletal:        General: Normal range of motion.     Cervical back: Normal range of motion.  Skin:    General: Skin is warm.     Capillary Refill: Capillary refill takes less than 2 seconds.  Neurological:     General: No focal deficit present.     Mental Status: She is alert.     Comments: Agitated, moving all extremities   Psychiatric:     Comments: Tearful, crying, poor judgment      ED Results / Procedures / Treatments   Labs (all labs ordered are listed, but only abnormal results are displayed) Labs Reviewed  CBC WITH DIFFERENTIAL/PLATELET  COMPREHENSIVE METABOLIC PANEL  ETHANOL  SALICYLATE LEVEL  ACETAMINOPHEN LEVEL  RAPID URINE DRUG SCREEN, HOSP PERFORMED  I-STAT BETA HCG BLOOD, ED ([facility], [facility], [facility] ONLY)    EKG None  Radiology No results found.  Procedures Procedures (including critical care time)  Medications Ordered in ED Medications  sodium chloride 0.9 % bolus 1,000 mL (has no administration in time range)  LORazepam (ATIVAN) injection 2 mg (has no administration in time range)  haloperidol lactate (HALDOL) injection 2 mg (has no administration in time range)    ED Course  I have reviewed the triage vital signs and the nursing notes.  Pertinent labs & imaging results that were available during my care of the patient were reviewed by me and considered in my medical decision making (see chart for details).    MDM Rules/Calculators/A&P                         Penny Mata is a 44 y.o. female here with hallucinations.  Patient is hallucinating and possibly taking too much of her adderall. However, the time frame is unclear. She is tachycardic in the ED. Will give  ativan, haldol, check psych clearance labs and consult TTS.   10:43 PM K 2.6, supplemented. UDS + amphetamine, marijuana. Medically cleared for psych eval after potassium load. Patient doesn't really want to stay and has labile mood so I filled out IVC paperwork.    Final Clinical Impression(s) / ED Diagnoses Final diagnoses:  None    Rx / DC Orders ED Discharge Orders    None       Charlynne Pander, MD 04/05/20 2253

## 2020-04-05 NOTE — ED Notes (Signed)
Date and time results received: 04/05/20 9:40 PM   Test: potassium  Critical Value: 2.6  Name of Provider Notified: Silverio Lay, MD  Orders Received? Or Actions Taken?: action taken

## 2020-04-05 NOTE — ED Notes (Signed)
Patient is very aggressive and hitting. Patient is also rude and cussing people out.

## 2020-04-06 DIAGNOSIS — F15950 Other stimulant use, unspecified with stimulant-induced psychotic disorder with delusions: Secondary | ICD-10-CM

## 2020-04-06 DIAGNOSIS — F322 Major depressive disorder, single episode, severe without psychotic features: Secondary | ICD-10-CM

## 2020-04-06 MED ORDER — POTASSIUM CHLORIDE 10 MEQ/100ML IV SOLN
INTRAVENOUS | Status: AC
  Administered 2020-04-06: 10 meq via INTRAVENOUS
  Filled 2020-04-06: qty 100

## 2020-04-06 MED ORDER — NICOTINE 21 MG/24HR TD PT24
21.0000 mg | MEDICATED_PATCH | Freq: Once | TRANSDERMAL | Status: AC
  Administered 2020-04-06: 21 mg via TRANSDERMAL
  Filled 2020-04-06: qty 1

## 2020-04-06 MED ORDER — GABAPENTIN 300 MG PO CAPS
300.0000 mg | ORAL_CAPSULE | Freq: Two times a day (BID) | ORAL | Status: DC
  Administered 2020-04-06 – 2020-04-08 (×5): 300 mg via ORAL
  Filled 2020-04-06 (×5): qty 1

## 2020-04-06 NOTE — [facility] Counselor (Addendum)
Clinician will have to see pt in a private room. Clinician noted form Jeneen, RN pt's room mate is getting discharged, she will set up TTS cart.   Elsie Ra, RN to call TTS cart for clinician to complete pt's assessment.    Redmond Pulling, MS, [facility] Triage Specialist [phone]

## 2020-04-06 NOTE — [facility] Counselor (Signed)
TTS cart has not been called for clinician to complete pt's assessment. Clinician to move to next pt in que.    Redmond Pulling, MS, [facility], [facility] Triage Specialist [phone]
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
## 2020-04-06 NOTE — Care Management (Signed)
  Writer referred patient to the following facilities:   [facility] Details  Fax        [address], [place] [address]    Internal comment    [facility] Details  Fax        [address] [place] [address]    Internal comment    [facility] Details  Fax        [address], [place] [address]    Internal comment    [facility] Details  Fax        [address], [place] [address]    Internal comment    [facility] Details  Fax        [address] [place], [place] [address]    Internal comment    [facility] Details  Fax        [address] [place] [address]    Internal comment    [facility] Details  Fax        [phone]. [address], [place] [address]    Internal comment    [facility] Details  Fax        [address], [place] [address]    Internal comment    [facility] Details  Fax        [address] [place] [address]    Internal comment    [facility] Details  Fax        [address] [place] [address]    Internal comment    [facility] Details  Fax        [address] [place] [address]    Internal comment    [facility] Details  Fax        [address] [place] [address]    Internal comment    [facility] Office Details  Fax        [address] [place] [address]    Internal comment    [facility] Details  Fax        [address] [place] [address]    Internal comment    [facility] Details  Fax        [address] [place], [place] [address]    Internal comment    [facility] Details  Fax        [address] [place] [address]    Internal  comment

## 2020-04-06 NOTE — BH Assessment (Signed)
[facility] Assessment Progress Note Case was staffed with Arvilla Market NP who recommended a inpatient admission as appropriate  bed placement is investigated.

## 2020-04-07 LAB — SARS CORONAVIRUS 2 BY RT PCR (HOSPITAL ORDER, PERFORMED IN ~~LOC~~ HOSPITAL LAB): SARS Coronavirus 2: NEGATIVE

## 2020-04-07 LAB — URINALYSIS, ROUTINE W REFLEX MICROSCOPIC
Bilirubin Urine: NEGATIVE
Glucose, UA: NEGATIVE mg/dL
Ketones, ur: NEGATIVE mg/dL
Nitrite: POSITIVE — AB
Protein, ur: NEGATIVE mg/dL
Specific Gravity, Urine: 1.01 (ref 1.005–1.030)
WBC, UA: >50 WBC/hpf — ABNORMAL HIGH (ref 0–5)
pH: 6 (ref 5.0–8.0)

## 2020-04-07 LAB — BASIC METABOLIC PANEL
Anion gap: 11 (ref 5–15)
BUN: 11 mg/dL (ref 6–20)
CO2: 23 mmol/L (ref 22–32)
Calcium: 8.8 mg/dL — ABNORMAL LOW (ref 8.9–10.3)
Chloride: 102 mmol/L (ref 98–111)
Creatinine, Ser: 0.61 mg/dL (ref 0.44–1.00)
GFR calc Af Amer: >60 mL/min (ref >60)
GFR calc non Af Amer: >60 mL/min (ref >60)
Glucose, Bld: 268 mg/dL — ABNORMAL HIGH (ref 70–99)
Potassium: 3.3 mmol/L — ABNORMAL LOW (ref 3.5–5.1)
Sodium: 136 mmol/L (ref 135–145)

## 2020-04-07 MED ORDER — ACETAMINOPHEN 325 MG PO TABS
650.0000 mg | ORAL_TABLET | Freq: Four times a day (QID) | ORAL | Status: DC | PRN
  Administered 2020-04-07: 650 mg via ORAL
  Filled 2020-04-07: qty 2

## 2020-04-07 MED ORDER — NICOTINE 21 MG/24HR TD PT24
21.0000 mg | MEDICATED_PATCH | Freq: Every day | TRANSDERMAL | Status: DC
  Administered 2020-04-07 – 2020-04-08 (×2): 21 mg via TRANSDERMAL
  Filled 2020-04-07 (×2): qty 1

## 2020-04-07 NOTE — BH Assessment (Signed)
Per Ophelia Shoulder, NP, patient continues to meet criteria for inpatient treatment. Patient referred to the following facilities for consideration of bed placement:  [facility]  [facility]  [facility]  [facility]  [facility]  [facility] Details  [facility]  [facility]  [facility]

## 2020-04-07 NOTE — ED Notes (Signed)
Patient pulled her Nicotine patch off and placed it in her styrofoam food container and then asked writer if she could put that one back on. Writer informed the patient that a patch would be available at 1830 as ordered.

## 2020-04-07 NOTE — ED Provider Notes (Signed)
Emergency Medicine Observation Re-evaluation Note  Penny Mata is a 44 y.o. female, seen on rounds today.  Pt initially presented to the ED for complaints of Altered Mental Status Currently, the patient is in ED under ivc for depression possible od of meds  Physical Exam  BP 137/88 (BP Location: Right Arm)   Pulse (!) 118   Temp 98.5 F (36.9 C) (Oral)   Resp 20   Ht 1.676 m (5\' 6" )   Wt 72.6 kg   SpO2 100%   BMI 25.82 kg/m  Physical Exam General: resting Cardiac: rrr hr 97 Lungs: cta Psych: depressed, tearful  ED Course / MDM  EKG:EKG Interpretation  Date/Time:  Friday April 05 2020 19:33:01 EDT Ventricular Rate:  112 PR Interval:    QRS Duration: 89 QT Interval:  313 QTC Calculation: 428 R Axis:   63 Text Interpretation: Sinus tachycardia Probable left atrial enlargement Repol abnrm suggests ischemia, diffuse leads Since last tracing rate faster Confirmed by 09-05-1988 [phone]) on 04/05/2020 8:39:23 PM    I have reviewed the labs performed to date as well as medications administered while in observation.  Recent changes in the last 24 hours include potassium replenished.  Plan  Current plan is for inpatient psych treatment. Patient is under full IVC at this time. Plan urinalysis-uds done but no ua in system Recheck potassium covid Patient reportedly [facility] bed at [facility]   04/07/2020, MD 04/07/20 [phone]

## 2020-04-07 NOTE — ED Notes (Signed)
Pt provided with lunch tray.

## 2020-04-07 NOTE — ED Notes (Signed)
Patient's husband visiting the patient.

## 2020-04-07 NOTE — ED Notes (Signed)
Pt has been sleeping most of the night. Pt has been very cooperative and has not voiced intent on suicide for duration of shift

## 2020-04-08 ENCOUNTER — Encounter (HOSPITAL_COMMUNITY): Payer: Self-pay | Admitting: Registered Nurse

## 2020-04-08 DIAGNOSIS — F15950 Other stimulant use, unspecified with stimulant-induced psychotic disorder with delusions: Secondary | ICD-10-CM

## 2020-04-08 DIAGNOSIS — F32A Depression, unspecified: Secondary | ICD-10-CM | POA: Insufficient documentation

## 2020-04-08 DIAGNOSIS — F329 Major depressive disorder, single episode, unspecified: Secondary | ICD-10-CM

## 2020-04-08 DIAGNOSIS — F1520 Other stimulant dependence, uncomplicated: Secondary | ICD-10-CM

## 2020-04-08 MED ORDER — FOSFOMYCIN TROMETHAMINE 3 G PO PACK
3.0000 g | PACK | Freq: Once | ORAL | Status: AC
  Administered 2020-04-08: 3 g via ORAL
  Filled 2020-04-08: qty 3

## 2020-04-08 NOTE — BH Assessment (Addendum)
[facility] Assessment Progress Note  This pt presents under IVC initiated by EDP Chaney Malling, MD.  At the request of Assunta Found, FNP, this writer called pt's husband, Walta [place] [phone]), to ascertain whether pt can return to the household.  Call was placed at 10:34.  Mr Burkman is upset that pt may be discharged from [facility], but he does not believe that pt's overuse of Adderall was a suicide attempt.  He believes that pt's use was recreational.  He reports that he had visited the pt over the weekend, and it was his understanding that she wanted to be admitted to a substance abuse treatment program.  I informed him that a final decision had not been made, and that I needed to staff findings with Shuvon.  Mr Woehrle reports that pt will be permitted to return to the household.  Doylene Canning, [place] Behavioral Health Coordinator [phone]  Addendum:  After consulting with [facility], it has been determined that pt does not require psychiatric hospitalization at this time.  IVC has been rescinded by Nelly Rout, MD.  Pt would benefit from seeing Peer Support Specialists, and a peer support consult has been ordered for pt.  Arlys John Degraphenreid CPSS agrees to follow up with this Clinical research associate after meeting with pt to discusses referrals.  Doylene Canning, [place] Behavioral Health Coordinator [phone]  Addendum:  After speaking with pt, Arlys John has followed with me.  Discharge instructions advise pt to continue treatment with Milagros Evener, MD, pt's current outpatient provider.  I have also included information about area substance abuse treatment providers.  EDP Meridee Score, MD, Denice Bors and pt's nurse, Beth have been notified.  Doylene Canning, [place] Behavioral Health Coordinator [phone]

## 2020-04-08 NOTE — Patient Outreach (Signed)
ED Peer Support Specialist Patient Intake (Complete at intake & 30-60 Day Follow-up)  Name: Penny Mata  MRN: [id number]  Age: 44 y.o.   Date of Admission: 04/08/2020  Intake: Initial Comments:      Primary Reason Admitted: Altered Mental Status   Lab values: Alcohol/ETOH: Negative Positive UDS? Yes Amphetamines: No Barbiturates: Yes Benzodiazepines: No Cocaine: No Opiates: No Cannabinoids: Yes  Demographic information: Gender: Female Ethnicity: White Marital Status: Unknown Insurance Status: Uninsured/Self-pay Ecologist (Work Neurosurgeon, Physicist, medical, etc.: Yes Lives with: Partner/Spouse Living situation: House/Apartment  Reported Patient History: Patient reported health conditions: Depression Patient aware of HIV and hepatitis status: No  In past year, has patient visited ED for any reason? No  Number of ED visits:    Reason(s) for visit:    In past year, has patient been hospitalized for any reason? No  Number of hospitalizations:    Reason(s) for hospitalization:    In past year, has patient been arrested? No  Number of arrests:    Reason(s) for arrest:    In past year, has patient been incarcerated? No  Number of incarcerations:    Reason(s) for incarceration:    In past year, has patient received medication-assisted treatment? No  In past year, patient received the following treatments:    In past year, has patient received any harm reduction services? No  Did this include any of the following?    In past year, has patient received care from a mental health provider for diagnosis other than SUD? No  In past year, is this first time patient has overdosed? No  Number of past overdoses:    In past year, is this first time patient has been hospitalized for an overdose? No  Number of hospitalizations for overdose(s):    Is patient currently receiving treatment for a mental health diagnosis?  No  Patient reports experiencing difficulty participating in SUD treatment: No    Most important reason(s) for this difficulty?    Has patient received prior services for treatment? No  In past, patient has received services from following agencies:    Plan of Care:  Suggested follow up at these agencies/treatment centers: Other (comment)  Other information: CPSS met with Pt an was able to gain information to better understand the services Pt are seeking. CPSS was able to complete series of questions to understand how the assist Pt. Pt stated that she was willing retrieve information about affordable Psychiatric  Dr at this time. CPSS are also issuing Pt with Outpatient information.    Aaron Edelman Aviela Blundell, [place]  04/08/2020 12:49 PM

## 2020-04-08 NOTE — Discharge Instructions (Signed)
For your psychiatry you are advised to continue treatment with Milagros Evener, MD:       Milagros Evener, MD      [address]      [place], [place] [address]      [phone]  To help you maintain a sober lifestyle, a substance abuse treatment program may be beneficial to you.  Contact one of the following facilities at your earliest opportunity to ask about enrolling:  RESIDENTIAL PROGRAMS:       [facility]      [address] [place], [place] [address]      [phone]       [facility]      [address] [place], [place] [address]      [phone]       [facility]      [address] [place]      [place], [place] [address]      [phone]       [facility]      [address] [place], [place] [address]      [phone]  OUTPATIENT PROGRAMS:       [facility]      [address]      [place], [place] [address]      [phone]      Ask about their Substance Abuse Intensive Outpatient Program.  They also offer psychiatry/medication management and therapy.  New patients are being seen in their walk-in clinic.  Walk-in hours are Monday - Thursday from 8:00 am - 11:00 am for psychiatry, and Friday from 1:00 pm - 4:00 pm for therapy.  Walk-in patients are seen on a first come, first served basis, so try to arrive as early as possible for the best chance of being seen the same day.

## 2020-04-08 NOTE — Consult Note (Signed)
[facility] Psych ED Progress Note  04/08/2020 12:49 PM Penny Mata  MRN:  [id number]   Subjective:  "I'm feeling better today and I'm ready to go home.  I don't want to ever take Adderall again."  Assessment:  Penny Mata, 44 y.o., female patient seen face to face by this provider, consulted with Dr. Lucianne Muss; and chart reviewed on 04/08/20.  On evaluation Penny Mata reports she was brought to the hospital for "apparently overdosing on my Adderall. It was accidental.  I wasn't trying to kill myself; I guess I just took to many of them.  I see Dr. Evelene Croon and she prescribes them to me but they aren't really helpful and if they are going to cause this then I don't want to ever take them again."  Patient states that she vaguely remembers what happened prior to coming to hospital.  States that she has spoken to her husband and that he is supportive and gave permission to speak to him for collateral information.  Patient state that she doesn't need inpatient psychiatric services.  During evaluation Penny Mata is alert/oriented x 4; calm/cooperative; and mood is congruent with affect.  She does not appear to be responding to internal/external stimuli or delusional thoughts.  Patient denies suicidal/self-harm/homicidal ideation, psychosis, and paranoia.  Patient answered question appropriately.    Doylene Canning spoke to patients husband who reports that patient is abusing her Adderall and that her overdoses wasn't intentional; but she does need help for substance abuse.  For full details of report see his note.    Principal Problem: Amphetamine and psychostimulant-induced psychosis with delusions (HCC) Diagnosis:  Principal Problem:   Amphetamine and psychostimulant-induced psychosis with delusions (HCC) Active Problems:   PTSD (post-traumatic stress disorder)   Benzodiazepine abuse (HCC)   Major depressive disorder, single episode, severe (HCC)   Amphetamine  use disorder, moderate, dependence (HCC)  Total Time spent with patient: 30 minutes  Past Psychiatric History: See above      Past Medical History:  Past Medical History:  Diagnosis Date  . Alcohol abuse   . Anxiety   . Depression   . Ovarian cyst     Past Surgical History:  Procedure Laterality Date  . CESAREAN SECTION    . TOTAL KNEE ARTHROPLASTY     Family History:  Family History  Problem Relation Age of Onset  . Depression Mother   . Anxiety disorder Father   . Depression Father   . Alcohol abuse Father   . Drug abuse Father   . Depression Sister    Family Psychiatric  History: See above Social History:  Social History   Substance and Sexual Activity  Alcohol Use No  . Alcohol/week: 1.0 - 2.0 standard drink  . Types: 1 - 2 Cans of beer per week     Social History   Substance and Sexual Activity  Drug Use No   Comment: Denies any drug use    Social History   Socioeconomic History  . Marital status: Married    Spouse name: Not on file  . Number of children: Not on file  . Years of education: Not on file  . Highest education level: Not on file  Occupational History  . Not on file  Tobacco Use  . Smoking status: Current Every Day Smoker    Packs/day: 0.50    Types: Cigarettes  . Smokeless tobacco: Never Used  Substance and Sexual Activity  . Alcohol use: No    Alcohol/week:  1.0 - 2.0 standard drink    Types: 1 - 2 Cans of beer per week  . Drug use: No    Comment: Denies any drug use  . Sexual activity: Not Currently    Birth control/protection: Abstinence  Other Topics Concern  . Not on file  Social History Narrative  . Not on file   Social Determinants of Health   Financial Resource Strain:   . Difficulty of Paying Living Expenses: Not on file  Food Insecurity:   . Worried About Programme researcher, broadcasting/film/videounning Out of Food in the Last Year: Not on file  . Ran Out of Food in the Last Year: Not on file  Transportation Needs:   . Lack of Transportation  (Medical): Not on file  . Lack of Transportation (Non-Medical): Not on file  Physical Activity:   . Days of Exercise per Week: Not on file  . Minutes of Exercise per Session: Not on file  Stress:   . Feeling of Stress : Not on file  Social Connections:   . Frequency of Communication with Friends and Family: Not on file  . Frequency of Social Gatherings with Friends and Family: Not on file  . Attends Religious Services: Not on file  . Active Member of Clubs or Organizations: Not on file  . Attends BankerClub or Organization Meetings: Not on file  . Marital Status: Not on file    Sleep: Good  Appetite:  Good  Current Medications: Current Facility-Administered Medications  Medication Dose Route Frequency Provider Last Rate Last Admin  . acetaminophen (TYLENOL) tablet 650 mg  650 mg Oral Q6H PRN Charlynne PanderYao, David Hsienta, MD   650 mg at 04/07/20 2049  . gabapentin (NEURONTIN) capsule 300 mg  300 mg Oral BID Thedore MinsAkintayo, Mojeed, MD   300 mg at 04/08/20 0925  . LORazepam (ATIVAN) tablet 1 mg  1 mg Oral Q4H PRN Charlynne PanderYao, David Hsienta, MD   1 mg at 04/07/20 2049  . nicotine (NICODERM CQ - dosed in mg/24 hours) patch 21 mg  21 mg Transdermal Daily Charlynne PanderYao, David Hsienta, MD   21 mg at 04/08/20 [id number]   Current Outpatient Medications  Medication Sig Dispense Refill  . acetaminophen (TYLENOL) 500 MG tablet Take 1,000 mg by mouth every 6 (six) hours as needed. For pain    . ALPRAZolam (XANAX) 1 MG tablet Take 1 mg by mouth every 6 (six) hours as needed for anxiety.    Marland Kitchen. amphetamine-dextroamphetamine (ADDERALL) 20 MG tablet Take 20 mg by mouth 2 (two) times daily.     . DULoxetine (CYMBALTA) 60 MG capsule Take 60 mg by mouth daily.    Marland Kitchen. ibuprofen (ADVIL,MOTRIN) 200 MG tablet Take 800 mg by mouth every 6 (six) hours as needed. For pain     . cephALEXin (KEFLEX) 500 MG capsule Take 1 capsule (500 mg total) by mouth 2 (two) times daily. (Patient not taking: Reported on 04/07/2020) 14 capsule 0    Lab Results:  Results  for orders placed or performed during the hospital encounter of 04/05/20 (from the past 48 hour(s))  SARS Coronavirus 2 by RT PCR (hospital order, [facility]) Nasopharyngeal Nasopharyngeal Swab     Status: None   Collection Time: 04/07/20  9:02 AM   Specimen: Nasopharyngeal Swab  Result Value Ref Range   SARS Coronavirus 2 NEGATIVE NEGATIVE    Comment: (NOTE) SARS-CoV-2 target nucleic acids are NOT DETECTED.  The SARS-CoV-2 RNA is generally detectable in upper and lower respiratory specimens during  the acute phase of infection. The lowest concentration of SARS-CoV-2 viral copies this assay can detect is 250 copies / mL. A negative result does not preclude SARS-CoV-2 infection and should not be used as the sole basis for treatment or other patient management decisions.  A negative result may occur with improper specimen collection / handling, submission of specimen other than nasopharyngeal swab, presence of viral mutation(s) within the areas targeted by this assay, and inadequate number of viral copies (<250 copies / mL). A negative result must be combined with clinical observations, patient history, and epidemiological information.  Fact Sheet for Patients:   BoilerBrush.com.cy  Fact Sheet for Healthcare Providers: https://pope.com/  This test is not yet approved or  cleared by the [place] FDA and has been authorized for detection and/or diagnosis of SARS-CoV-2 by FDA under an Emergency Use Authorization (EUA).  This EUA will remain in effect (meaning this test can be used) for the duration of the COVID-19 declaration under Section 564(b)(1) of the Act, 21 U.S.C. section 360bbb-3(b)(1), unless the authorization is terminated or revoked sooner.  Performed at [facility], [address], [place], [place] [address]   Basic metabolic panel     Status: Abnormal   Collection Time:  04/07/20  9:51 AM  Result Value Ref Range   Sodium 136 135 - 145 mmol/L   Potassium 3.3 (L) 3.5 - 5.1 mmol/L    Comment: DELTA CHECK NOTED   Chloride 102 98 - 111 mmol/L   CO2 23 22 - 32 mmol/L   Glucose, Bld 268 (H) 70 - 99 mg/dL    Comment: Glucose reference range applies only to samples taken after fasting for at least 8 hours.   BUN 11 6 - 20 mg/dL   Creatinine, Ser 7.51 0.44 - 1.00 mg/dL   Calcium 8.8 (L) 8.9 - 10.3 mg/dL   GFR calc non Af Amer >60 >60 mL/min   GFR calc Af Amer >60 >60 mL/min   Anion gap 11 5 - 15    Comment: Performed at [facility], [address], [place], [place] [address]  Urinalysis, Routine w reflex microscopic Urine, Clean Catch     Status: Abnormal   Collection Time: 04/07/20  2:06 PM  Result Value Ref Range   Color, Urine YELLOW YELLOW   APPearance CLOUDY (A) CLEAR   Specific Gravity, Urine 1.010 1.005 - 1.030   pH 6.0 5.0 - 8.0   Glucose, UA NEGATIVE NEGATIVE mg/dL   Hgb urine dipstick SMALL (A) NEGATIVE   Bilirubin Urine NEGATIVE NEGATIVE   Ketones, ur NEGATIVE NEGATIVE mg/dL   Protein, ur NEGATIVE NEGATIVE mg/dL   Nitrite POSITIVE (A) NEGATIVE   Leukocytes,Ua MODERATE (A) NEGATIVE   RBC / HPF 0-5 0 - 5 RBC/hpf   WBC, UA >50 (H) 0 - 5 WBC/hpf   Bacteria, UA MANY (A) NONE SEEN   Squamous Epithelial / LPF 6-10 0 - 5   Mucus PRESENT     Comment: Performed at [facility], [address], [place], [place] [address]    Blood Alcohol level:  Lab Results  Component Value Date   ETH <10 04/05/2020   ETH <10 08/27/2018    Physical Findings: AIMS:  , ,  ,  ,    CIWA:    COWS:     Musculoskeletal: Strength & Muscle Tone: within normal limits Gait & Station: normal Patient leans: N/A  Psychiatric Specialty Exam: Physical Exam Vitals and nursing note reviewed.  Exam conducted with a chaperone present.  Constitutional:      Appearance: Normal appearance.  HENT:     Head: Normocephalic.  Pulmonary:      Effort: Pulmonary effort is normal.  Musculoskeletal:        General: Normal range of motion.     Cervical back: Normal range of motion.  Skin:    General: Skin is warm and dry.  Neurological:     Mental Status: She is alert.  Psychiatric:        Attention and Perception: Attention and perception normal. She does not perceive auditory or visual hallucinations.        Mood and Affect: Mood and affect normal.        Speech: Speech normal.        Behavior: Behavior normal. Behavior is cooperative.        Thought Content: Thought content normal. Thought content is not paranoid. Thought content does not include homicidal or suicidal ideation.        Cognition and Memory: Cognition and memory normal.        Judgment: Judgment normal.     Review of Systems  Psychiatric/Behavioral: Negative for agitation, behavioral problems and confusion. Hallucinations: denies. Self-injury: Denies. Sleep disturbance: denies. Suicidal ideas: denies. Nervous/anxious: Stable.   All other systems reviewed and are negative.   Blood pressure 106/67, pulse (!) 105, temperature 98.2 F (36.8 C), temperature source Oral, resp. rate 18, height 5\' 6"  (1.676 m), weight 72.6 kg, SpO2 100 %.Body mass index is 25.82 kg/m.  General Appearance: Casual  Eye Contact:  Good  Speech:  Clear and Coherent and Normal Rate  Volume:  Normal  Mood:  "Fine"  Affect:  Appropriate and Congruent  Thought Process:  Coherent, Goal Directed and Descriptions of Associations: Intact  Orientation:  Full (Time, Place, and Person)  Thought Content:  WDL  Suicidal Thoughts:  No  Homicidal Thoughts:  No  Memory:  Immediate;   Good Recent;   Good  Judgement:  Intact  Insight:  Present  Psychomotor Activity:  Normal  Concentration:  Concentration: Good and Attention Span: Good  Recall:  Good  Fund of Knowledge:  Good  Language:  Good  Akathisia:  No  Handed:  Right  AIMS (if indicated):     Assets:  Communication Skills Desire  for Improvement Housing Physical Health Social Support  ADL's:  Intact  Cognition:  WNL  Sleep:       Treatment Plan Summary: Plan Peer support to give resources for substance use disorder  Patient for follow up with current outpatient psychiatric provider  Disposition:  Psychiatrically cleared No evidence of imminent risk to self or others at present.   Patient does not meet criteria for psychiatric inpatient admission. Supportive therapy provided about ongoing stressors. Refer to IOP. Discussed crisis plan, support from social network, calling 911, coming to the Emergency Department, and calling Suicide Hotline.  Meaghann Choo, NP 04/08/2020, 12:49 PM

## 2020-04-08 NOTE — Patient Outreach (Signed)
[facility] contacted transportation for transport home.

## 2020-04-08 NOTE — ED Provider Notes (Addendum)
Emergency Medicine Observation Re-evaluation Note  Penny Mata is a 44 y.o. female, seen on rounds today.  Pt initially presented to the ED for complaints of Altered Mental Status Currently, the patient is resting.  Physical Exam  BP 106/67 (BP Location: Right Arm)   Pulse (!) 105   Temp 98.2 F (36.8 C) (Oral)   Resp 18   Ht 5\' 6"  (1.676 m)   Wt 72.6 kg   SpO2 100%   BMI 25.82 kg/m  Physical Exam CONSTITUTIONAL: Well-appearing, NAD NEURO: Sleeping with no focal deficits ENT/NECK:  supple, no JVD CARDIO: Regular rate, well-perfused PULM: No increased work of breathing GI/GU: Nondistended MSK/SPINE:  No gross deformities, no edema SKIN:  no rash, atraumatic PSYCH: Deferred  ED Course / MDM  EKG:EKG Interpretation  Date/Time:  Friday April 05 2020 19:33:01 EDT Ventricular Rate:  112 PR Interval:    QRS Duration: 89 QT Interval:  313 QTC Calculation: 428 R Axis:   63 Text Interpretation: Sinus tachycardia Probable left atrial enlargement Repol abnrm suggests ischemia, diffuse leads Since last tracing rate faster Confirmed by 09-05-1988 [phone]) on 04/05/2020 8:39:23 PM    I have reviewed the labs performed to date as well as medications administered while in observation.  Recent changes in the last 24 hours include none.  Plan  Current plan is for inpatient placement.  Addendum 12:52 PM: Patient has been cleared by psych and will be discharged.  Urinalysis showing evidence of infection, will treat with single dose fosfomycin.   04/07/2020, MD 04/08/20 04/10/20    [id number], MD 04/08/20 [phone]

## 2020-04-08 NOTE — ED Notes (Signed)
Pt DC d off unit to home per provider. Pt alert, calm, cooperative, no s/s of distress. DC information given to and reviewed with  Pt, pt acknowledged understanding. Pt ambulatory off unit, escorted by NT. Pt transported by [employer].

## 2020-04-15 IMAGING — CR RIGHT FOREARM - 2 VIEW
2 series · 2 of 2 positions shown · non-contrast
Comparison: None.

CLINICAL DATA: Laceration following breaking window, possible
foreign body

EXAM:
RIGHT FOREARM - 2 VIEW

[x forearm ap right]
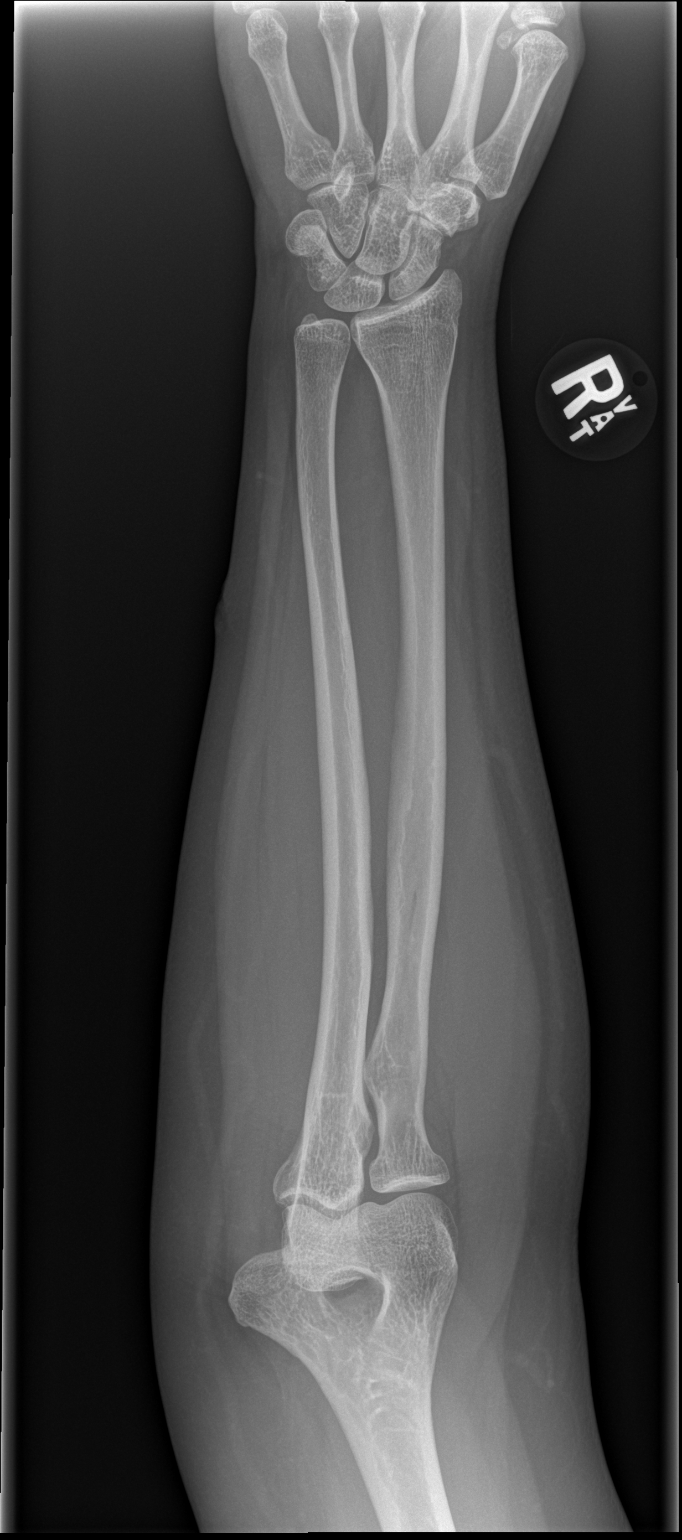

[x forearm lat right]
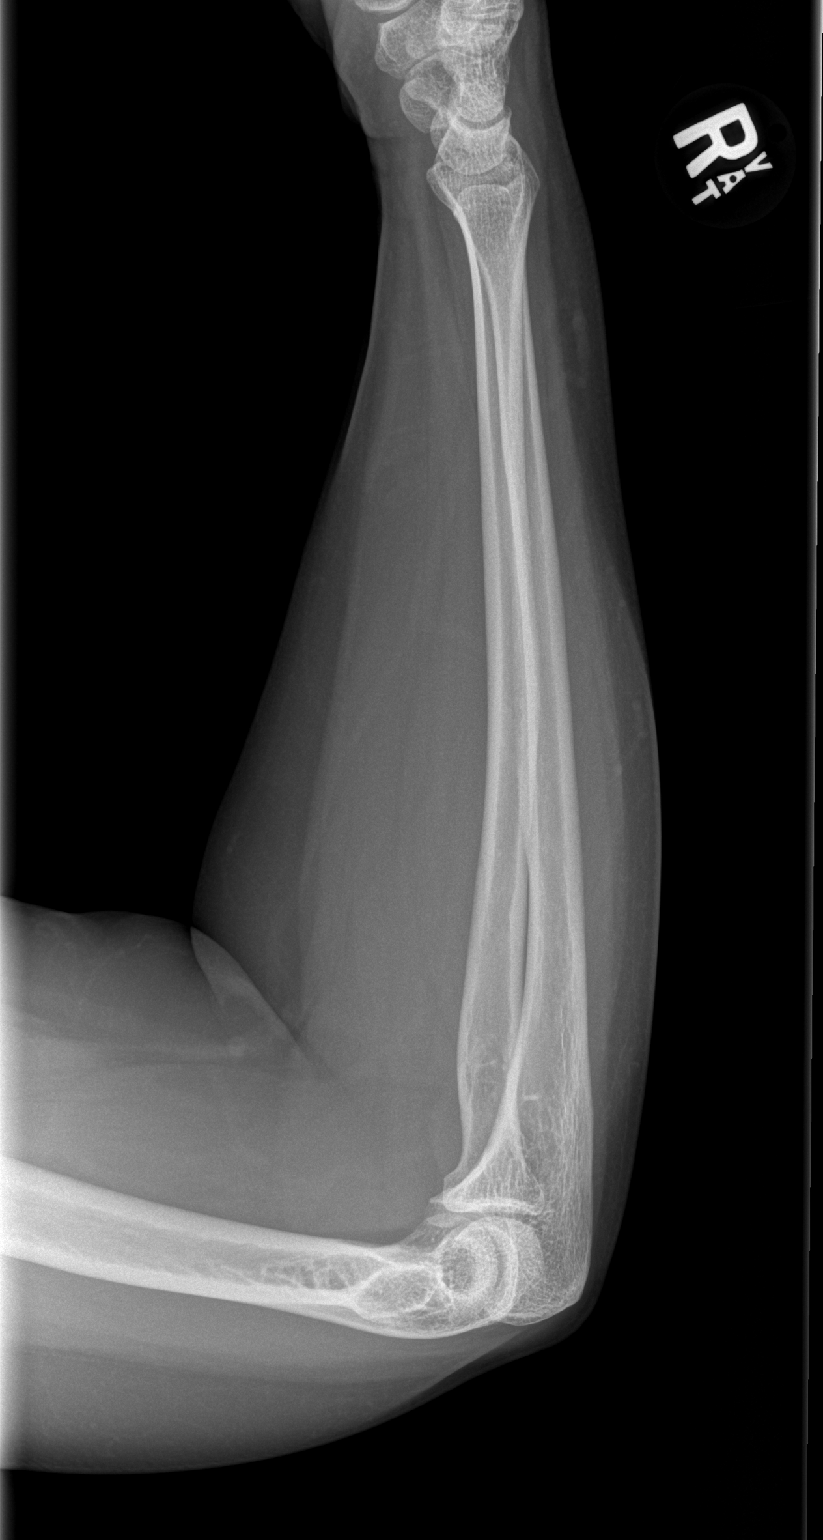

[2 of 2 positions shown; findings below may reference images not displayed]

FINDINGS: Laceration is identified medially. No bony abnormality is seen. No
radiopaque foreign body is noted.
IMPRESSION: Soft tissue injury without acute bony abnormality.
